# Patient Record
Sex: Male | Born: 1985 | Race: Black or African American | Hispanic: No | Marital: Single | Smoking: Never smoker
Health system: Southern US, Community
[De-identification: ages and names within clinical notes are randomized; demographics above are authoritative.]

## PROBLEM LIST (undated history)

## (undated) ENCOUNTER — Ambulatory Visit (HOSPITAL_COMMUNITY): Payer: Medicaid Other

## (undated) ENCOUNTER — Ambulatory Visit (HOSPITAL_COMMUNITY): Admission: EM | Payer: Medicaid Other | Source: Home / Self Care

## (undated) DIAGNOSIS — Z9989 Dependence on other enabling machines and devices: Secondary | ICD-10-CM

## (undated) DIAGNOSIS — G43909 Migraine, unspecified, not intractable, without status migrainosus: Secondary | ICD-10-CM

## (undated) DIAGNOSIS — F909 Attention-deficit hyperactivity disorder, unspecified type: Secondary | ICD-10-CM

## (undated) DIAGNOSIS — M25569 Pain in unspecified knee: Secondary | ICD-10-CM

## (undated) DIAGNOSIS — G8929 Other chronic pain: Secondary | ICD-10-CM

## (undated) HISTORY — PX: OTHER SURGICAL HISTORY: SHX169

---
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
## 2006-01-03 ENCOUNTER — Emergency Department (HOSPITAL_COMMUNITY): Admission: EM | Admit: 2006-01-03 | Discharge: 2006-01-04 | Payer: Self-pay | Admitting: Emergency Medicine

## 2008-07-14 ENCOUNTER — Emergency Department (HOSPITAL_COMMUNITY): Admission: EM | Admit: 2008-07-14 | Discharge: 2008-07-14 | Payer: Self-pay | Admitting: Emergency Medicine

## 2008-09-04 HISTORY — PX: OTHER SURGICAL HISTORY: SHX169

## 2008-10-13 ENCOUNTER — Emergency Department (HOSPITAL_COMMUNITY): Admission: EM | Admit: 2008-10-13 | Discharge: 2008-10-13 | Payer: Self-pay | Admitting: Family Medicine

## 2009-03-06 ENCOUNTER — Ambulatory Visit (HOSPITAL_COMMUNITY): Admission: EM | Admit: 2009-03-06 | Discharge: 2009-03-06 | Payer: Self-pay | Admitting: Emergency Medicine

## 2009-12-17 ENCOUNTER — Ambulatory Visit: Payer: Self-pay | Admitting: Family Medicine

## 2009-12-17 DIAGNOSIS — S025XXA Fracture of tooth (traumatic), initial encounter for closed fracture: Secondary | ICD-10-CM | POA: Insufficient documentation

## 2010-02-10 ENCOUNTER — Emergency Department (HOSPITAL_COMMUNITY): Admission: EM | Admit: 2010-02-10 | Discharge: 2010-02-10 | Payer: Self-pay | Admitting: Emergency Medicine

## 2010-09-14 ENCOUNTER — Emergency Department (HOSPITAL_COMMUNITY)
Admission: EM | Admit: 2010-09-14 | Discharge: 2010-09-14 | Payer: Self-pay | Source: Home / Self Care | Admitting: Emergency Medicine
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
## 2010-10-19 ENCOUNTER — Encounter: Payer: Self-pay | Admitting: *Deleted

## 2010-11-21 LAB — RAPID STREP SCREEN (MED CTR MEBANE ONLY): Streptococcus, Group A Screen (Direct): POSITIVE — AB

## 2010-12-11 LAB — POCT I-STAT, CHEM 8
BUN: 8 mg/dL (ref 6–23)
Calcium, Ion: 1.17 mmol/L (ref 1.12–1.32)
Creatinine, Ser: 0.9 mg/dL (ref 0.4–1.5)
Glucose, Bld: 103 mg/dL — ABNORMAL HIGH (ref 70–99)
TCO2: 27 mmol/L (ref 0–100)

## 2010-12-11 LAB — CBC
HCT: 44.3 % (ref 39.0–52.0)
Hemoglobin: 15.1 g/dL (ref 13.0–17.0)
RDW: 13.5 % (ref 11.5–15.5)
WBC: 11.1 10*3/uL — ABNORMAL HIGH (ref 4.0–10.5)

## 2010-12-11 LAB — DIFFERENTIAL
Eosinophils Relative: 0 % (ref 0–5)
Lymphocytes Relative: 7 % — ABNORMAL LOW (ref 12–46)
Lymphs Abs: 0.8 10*3/uL (ref 0.7–4.0)
Monocytes Absolute: 0.8 10*3/uL (ref 0.1–1.0)
Monocytes Relative: 7 % (ref 3–12)
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
## 2012-02-29 ENCOUNTER — Encounter (HOSPITAL_COMMUNITY): Payer: Self-pay | Admitting: *Deleted

## 2012-02-29 ENCOUNTER — Emergency Department (HOSPITAL_COMMUNITY)
Admission: EM | Admit: 2012-02-29 | Discharge: 2012-02-29 | Disposition: A | Discharge status: Home / Self Care | Payer: Self-pay | Attending: Emergency Medicine | Admitting: Emergency Medicine

## 2012-02-29 DIAGNOSIS — T310 Burns involving less than 10% of body surface: Secondary | ICD-10-CM | POA: Insufficient documentation

## 2012-02-29 DIAGNOSIS — T23009A Burn of unspecified degree of unspecified hand, unspecified site, initial encounter: Secondary | ICD-10-CM

## 2012-02-29 DIAGNOSIS — Y9239 Other specified sports and athletic area as the place of occurrence of the external cause: Secondary | ICD-10-CM | POA: Insufficient documentation

## 2012-02-29 DIAGNOSIS — X12XXXA Contact with other hot fluids, initial encounter: Secondary | ICD-10-CM | POA: Insufficient documentation

## 2012-02-29 DIAGNOSIS — T23109A Burn of first degree of unspecified hand, unspecified site, initial encounter: Secondary | ICD-10-CM | POA: Insufficient documentation

## 2012-02-29 DIAGNOSIS — Y92838 Other recreation area as the place of occurrence of the external cause: Secondary | ICD-10-CM | POA: Insufficient documentation

## 2012-02-29 HISTORY — DX: Migraine, unspecified, not intractable, without status migrainosus: G43.909

## 2012-02-29 NOTE — Discharge Instructions (Signed)
Burn Care Your skin is a natural barrier to infection. It is the largest organ of your body. Burns damage this natural protection. To help prevent infection, it is very important to follow your caregiver's instructions in the care of your burn. Burns are classified as:  First degree. There is only redness of the skin (erythema). No scarring is expected.   Second degree. There is blistering of the skin. Scarring may occur with deeper burns.   Third degree. All layers of the skin are injured, and scarring is expected.  HOME CARE INSTRUCTIONS   Wash your hands well before changing your bandage.   Change your bandage as often as directed by your caregiver.   Remove the old bandage. If the bandage sticks, you may soak it off with cool, clean water.   Cleanse the burn thoroughly but gently with mild soap and water.   Pat the area dry with a clean, dry cloth.   Apply a thin layer of antibacterial cream to the burn.   Apply a clean bandage as instructed by your caregiver.   Keep the bandage as clean and dry as possible.   Elevate the affected area for the first 24 hours, then as instructed by your caregiver.   Only take over-the-counter or prescription medicines for pain, discomfort, or fever as directed by your caregiver.  SEEK IMMEDIATE MEDICAL CARE IF:   You develop excessive pain.   You develop redness, tenderness, swelling, or red streaks near the burn.   The burned area develops yellowish-white fluid (pus) or a bad smell.   You have a fever.  MAKE SURE YOU:   Understand these instructions.   Will watch your condition.   Will get help right away if you are not doing well or get worse.  Document Released: 08/21/2005 Document Revised: 08/10/2011 Document Reviewed: 01/11/2011 [facility] Patient Information 2012 [place], [place].    RESOURCE GUIDE  Chronic Pain Problems: Contact [facility]  [phone] Patients need to be referred by their primary care  doctor.  Insufficient Money for Medicine: Contact United Way:  call "211" or [facility] [phone].  No Primary Care Doctor: - Call Health Connect  [phone] - can help you locate a primary care doctor that  accepts your insurance, provides certain services, etc. - Physician Referral [phone]  Agencies that provide inexpensive medical care: - [facility]  [phone] - [facility]  [phone] - [facility]  [phone] [facility]  [phone] - Planned Parenthood  [phone] [facility]  [phone]  Medicaid-accepting [facility] Providers: - [facility]- [address]  [phone], Mon-Fri 9am-7pm, Sat 9am-1pm - [facility]- [address] [place], [address] [place]  [phone] - [facility]- [address], [address] [place]  [phone] [facility]- [address]  [phone] - Renaye Rakers- [address] [place], [address], [phone]  Only accepts [place] patients after they have their name  applied to their card  Self Pay (no insurance) in [place]: - Sickle Cell Patients: Dr Willey Blade, [facility]  [address] [place], [phone] - [facility]- [address] [place]  [phone]       [facility] [place]- [address]       -  [facility]- see information above (Speak to [employer] if you do not have insurance)       -  [facility]- [address] [place], [phone]       -  [facility]- [address],  [phone]       -  [facility]- [address] [phone]       -  Dr Julio Sicks-  [address], [address], [place], [phone]       -  [facility]- [address] [phone]       -  [facility]- [address] [phone], also [address], [phone]       -    [facility] [address] [place], [phone], 1st & 3rd Saturday   every month, 10am-1pm  1) Find a Doctor and Pay Out of Pocket Although you won't have to find out who is covered by your insurance plan, it is a good idea to ask around and get recommendations. You will then need to call the office and see if the doctor you have chosen will accept you as a new patient and what types of options they offer for patients who are self-pay. Some doctors offer discounts or will set up payment plans for their patients who do not have insurance, but you will need to ask so you aren't surprised when you get to your appointment.  2) Contact Your Local Health Department Not all health departments have doctors that can see patients for sick visits, but many do, so it is worth a call to see if yours does. If you don't know where your local health department is, you can check in your phone book. The CDC also has a tool to help you locate your state's health department, and many state websites also have listings of all of their local health departments.  3) Find a Walk-in Clinic If your illness is not likely to be very severe or complicated, you may want to try a walk in clinic. These are popping up all over the country in pharmacies, drugstores, and shopping centers. They're usually staffed by nurse practitioners or physician assistants that have been trained to treat common illnesses and complaints. They're usually fairly quick and inexpensive. However, if you have serious medical issues or chronic medical problems, these are probably not your best option  STD Testing - [facility] Department of [facility] [place], STD Clinic, [address], [place], phone [phone] or [phone].  Monday - Friday, call for an appointment. [facility] Department of [employer], STD Clinic, [place] [address], [place], phone [phone] or [phone].  Monday - Friday, call for an  appointment.  Abuse/Neglect: [facility] Child Abuse Hotline (404)887-0733 [facility] Child Abuse Hotline (731) 752-0299 (After Hours)  Emergency Shelter:  [facility] [phone]  Maternity Homes: - [facility] [phone] - [facility] (205) [phone]  MRSA Hotline #:   4585317156  [facility] Resources  [facility]  United Way [facility] Dept. [address]                 [address]         [address]  [address]  Sela Hua Phone:  [phone]                                  Phone:  [phone]                   Phone:  [place], [place] in [place], [address],                                  [place] [phone] or [phone] (After Hours)   [place]  Substance Abuse Resources: - Alcohol and Drug Services  2726217021 - [place] [phone] - The [place] [phone] Chinita Pester [phone] - Residential & Outpatient Substance Abuse Program  [phone]  Psychological Services: - [place]  [place]  [place], [phone] [place]. [address] [place], [place]: [phone] or [phone], PicCapture.uy  Dental Assistance  If unable to pay or uninsured, contact:  [facility] or [facility]. to become qualified for the adult dental clinic.  Patients with Medicaid: [facility] [phone] [address], [place] [address], [phone]  If unable to pay,  or uninsured, contact [facility] [phone]) or [place] [phone] in [place], [place] in [facility]) to become qualified for the adult dental clinic  Other [place], [place], [place], [address], [place], [place], 2nd and 4th Thursday of the month at 6:30am.  10 clients each day by appointment, can sometimes see walk-in patients if someone does not show for an appointment. [facility]- [address] [place]ard D[place]er [place], [place], [address], [place], [place], [place], [address], [place] Department- [place] Department- [place] Department- [phone]
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
## 2012-03-07 NOTE — ED Provider Notes (Signed)
Medical screening examination/treatment/procedure(s) were performed by non-physician practitioner and as supervising physician I was immediately available for consultation/collaboration.  Raeford Razor, MD 03/07/12 2045

## 2013-01-07 ENCOUNTER — Encounter (HOSPITAL_COMMUNITY): Payer: Self-pay | Admitting: Emergency Medicine

## 2013-01-07 ENCOUNTER — Emergency Department (HOSPITAL_COMMUNITY)
Admission: EM | Admit: 2013-01-07 | Discharge: 2013-01-07 | Disposition: A | Discharge status: Home / Self Care | Payer: Self-pay | Attending: Emergency Medicine | Admitting: Emergency Medicine

## 2013-01-07 DIAGNOSIS — L089 Local infection of the skin and subcutaneous tissue, unspecified: Secondary | ICD-10-CM | POA: Insufficient documentation

## 2013-01-07 DIAGNOSIS — R21 Rash and other nonspecific skin eruption: Secondary | ICD-10-CM

## 2013-01-07 DIAGNOSIS — R229 Localized swelling, mass and lump, unspecified: Secondary | ICD-10-CM | POA: Insufficient documentation

## 2013-01-07 DIAGNOSIS — Z8679 Personal history of other diseases of the circulatory system: Secondary | ICD-10-CM | POA: Insufficient documentation

## 2013-01-07 MED ORDER — SULFAMETHOXAZOLE-TRIMETHOPRIM 400-80 MG PO TABS: 1.0000 | ORAL_TABLET | Freq: Two times a day (BID) | ORAL | Status: DC

## 2013-01-07 MED ORDER — SULFAMETHOXAZOLE-TMP DS 800-160 MG PO TABS
1.0000 | ORAL_TABLET | Freq: Once | ORAL | Status: AC
  Administered 2013-01-07: 1 via ORAL
  Filled 2013-01-07: qty 1

## 2013-01-07 NOTE — ED Provider Notes (Signed)
History     CSN: [id number]  Arrival date & time 01/07/13  1231   First MD Initiated Contact with Patient 01/07/13 1246      Chief Complaint  Patient presents with  . Insect Bite    l/arm- 3 swollen, raised areas x 2 days    (Consider location/radiation/quality/duration/timing/severity/associated sxs/prior treatment) Patient is a 27 y.o. male presenting with rash. The history is provided by the patient.  Rash Location:  Shoulder/arm Shoulder/arm rash location:  L arm and R arm Severity:  Mild Associated symptoms: no fever and no myalgias   Associated symptoms comment:  Complains of tender swollen area to left forearm x 2 days. Minimal drainage. He has seen multiple spiders in his house and is concerned about being bitten.    Past Medical History  Diagnosis Date  . Migraine     History reviewed. No pertinent past surgical history.  History reviewed. No pertinent family history.  History  Substance Use Topics  . Smoking status: Never Smoker   . Smokeless tobacco: Not on file  . Alcohol Use: Yes      Review of Systems  Constitutional: Negative for fever and chills.  Musculoskeletal: Negative.  Negative for myalgias.  Skin: Positive for rash.       See HPI.  Neurological: Negative.  Negative for numbness.    Allergies  Review of patient's allergies indicates no known allergies.  Home Medications  No current outpatient prescriptions on file.  BP 125/78  Pulse 62  Temp(Src) 98.2 F (36.8 C) (Oral)  SpO2 96%  Physical Exam  Constitutional: He appears well-developed and well-nourished. No distress.  Musculoskeletal: Normal range of motion. He exhibits no edema.  Skin:  Swollen area measuring approximately 2 cm in diameter to left forearm (x1 dorsum, x1 posterior) and right upper arm posteriorly. No fever. Minimal discomfort. No drainage. There is red streaking extending from left dorsal lesion into upper arm.     ED Course  Procedures (including critical  care time)  Labs Reviewed - No data to display No results found.   No diagnosis found.  1. Rash   MDM  On further questioning, the patient reports that the tattoo on left forearm is 46 days old. DDx includes secondary infection, likely MRSA vs insect or spider bites without abscess. Will cover with antibiotics and follow up if symptoms worsen.        Arnoldo Hooker, PA-C 01/07/13 1319

## 2013-01-07 NOTE — ED Notes (Signed)
Pt reports that he noted 3 red raised spots on l/forearm x 2 days. Increased itching and redness this am

## 2013-01-07 NOTE — ED Provider Notes (Signed)
Medical screening examination/treatment/procedure(s) were performed by non-physician practitioner and as supervising physician I was immediately available for consultation/collaboration.  Gilda Crease, MD 01/07/13 1325

## 2014-11-12 ENCOUNTER — Encounter (HOSPITAL_COMMUNITY): Payer: Self-pay | Admitting: Emergency Medicine

## 2014-11-12 ENCOUNTER — Emergency Department (HOSPITAL_COMMUNITY)
Admission: EM | Admit: 2014-11-12 | Discharge: 2014-11-12 | Disposition: A | Discharge status: Home / Self Care | Payer: No Typology Code available for payment source | Attending: Emergency Medicine | Admitting: Emergency Medicine

## 2014-11-12 DIAGNOSIS — Y92410 Unspecified street and highway as the place of occurrence of the external cause: Secondary | ICD-10-CM | POA: Diagnosis not present

## 2014-11-12 DIAGNOSIS — Z8679 Personal history of other diseases of the circulatory system: Secondary | ICD-10-CM | POA: Insufficient documentation

## 2014-11-12 DIAGNOSIS — G8929 Other chronic pain: Secondary | ICD-10-CM | POA: Diagnosis not present

## 2014-11-12 DIAGNOSIS — S8992XA Unspecified injury of left lower leg, initial encounter: Secondary | ICD-10-CM | POA: Insufficient documentation

## 2014-11-12 DIAGNOSIS — Z792 Long term (current) use of antibiotics: Secondary | ICD-10-CM | POA: Diagnosis not present

## 2014-11-12 DIAGNOSIS — Y9389 Activity, other specified: Secondary | ICD-10-CM | POA: Insufficient documentation

## 2014-11-12 DIAGNOSIS — Y998 Other external cause status: Secondary | ICD-10-CM | POA: Diagnosis not present

## 2014-11-12 DIAGNOSIS — S3992XA Unspecified injury of lower back, initial encounter: Secondary | ICD-10-CM | POA: Insufficient documentation

## 2014-11-12 MED ORDER — CYCLOBENZAPRINE HCL 10 MG PO TABS: 10.0000 mg | ORAL_TABLET | Freq: Two times a day (BID) | ORAL | Status: DC | PRN

## 2014-11-12 MED ORDER — IBUPROFEN 800 MG PO TABS: 800.0000 mg | ORAL_TABLET | Freq: Three times a day (TID) | ORAL | Status: DC
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
## 2014-11-12 NOTE — Discharge Instructions (Signed)

## 2014-11-27 ENCOUNTER — Encounter (HOSPITAL_COMMUNITY): Payer: Self-pay | Admitting: Nurse Practitioner

## 2014-11-27 ENCOUNTER — Emergency Department (HOSPITAL_COMMUNITY)
Admission: EM | Admit: 2014-11-27 | Discharge: 2014-11-27 | Disposition: A | Discharge status: Home / Self Care | Payer: No Typology Code available for payment source | Attending: Emergency Medicine | Admitting: Emergency Medicine

## 2014-11-27 ENCOUNTER — Emergency Department (HOSPITAL_COMMUNITY): Payer: No Typology Code available for payment source

## 2014-11-27 DIAGNOSIS — Z792 Long term (current) use of antibiotics: Secondary | ICD-10-CM | POA: Diagnosis not present

## 2014-11-27 DIAGNOSIS — S8992XA Unspecified injury of left lower leg, initial encounter: Secondary | ICD-10-CM

## 2014-11-27 DIAGNOSIS — W231XXA Caught, crushed, jammed, or pinched between stationary objects, initial encounter: Secondary | ICD-10-CM | POA: Insufficient documentation

## 2014-11-27 DIAGNOSIS — Z79899 Other long term (current) drug therapy: Secondary | ICD-10-CM | POA: Diagnosis not present

## 2014-11-27 DIAGNOSIS — S3992XA Unspecified injury of lower back, initial encounter: Secondary | ICD-10-CM | POA: Diagnosis not present

## 2014-11-27 DIAGNOSIS — Y9289 Other specified places as the place of occurrence of the external cause: Secondary | ICD-10-CM | POA: Diagnosis not present

## 2014-11-27 DIAGNOSIS — G8929 Other chronic pain: Secondary | ICD-10-CM | POA: Diagnosis not present

## 2014-11-27 DIAGNOSIS — G43909 Migraine, unspecified, not intractable, without status migrainosus: Secondary | ICD-10-CM | POA: Diagnosis not present

## 2014-11-27 DIAGNOSIS — Z791 Long term (current) use of non-steroidal anti-inflammatories (NSAID): Secondary | ICD-10-CM | POA: Insufficient documentation

## 2014-11-27 DIAGNOSIS — Y998 Other external cause status: Secondary | ICD-10-CM | POA: Diagnosis not present

## 2014-11-27 DIAGNOSIS — Y9389 Activity, other specified: Secondary | ICD-10-CM | POA: Diagnosis not present

## 2014-11-27 DIAGNOSIS — M545 Low back pain: Secondary | ICD-10-CM

## 2014-11-27 HISTORY — DX: Other chronic pain: G89.29

## 2014-11-27 HISTORY — DX: Pain in unspecified knee: M25.569

## 2014-11-27 MED ORDER — NAPROXEN 375 MG PO TABS: 375.0000 mg | ORAL_TABLET | Freq: Two times a day (BID) | ORAL | Status: DC
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
## 2015-01-06 ENCOUNTER — Encounter (HOSPITAL_COMMUNITY): Payer: Self-pay | Admitting: *Deleted

## 2015-01-06 ENCOUNTER — Emergency Department (HOSPITAL_COMMUNITY)
Admission: EM | Admit: 2015-01-06 | Discharge: 2015-01-06 | Disposition: A | Discharge status: Home / Self Care | Payer: Self-pay | Attending: Emergency Medicine | Admitting: Emergency Medicine

## 2015-01-06 DIAGNOSIS — G43909 Migraine, unspecified, not intractable, without status migrainosus: Secondary | ICD-10-CM | POA: Insufficient documentation

## 2015-01-06 DIAGNOSIS — R519 Headache, unspecified: Secondary | ICD-10-CM

## 2015-01-06 DIAGNOSIS — Z79899 Other long term (current) drug therapy: Secondary | ICD-10-CM | POA: Insufficient documentation

## 2015-01-06 DIAGNOSIS — G8929 Other chronic pain: Secondary | ICD-10-CM | POA: Insufficient documentation

## 2015-01-06 DIAGNOSIS — Z791 Long term (current) use of non-steroidal anti-inflammatories (NSAID): Secondary | ICD-10-CM | POA: Insufficient documentation

## 2015-01-06 DIAGNOSIS — Z792 Long term (current) use of antibiotics: Secondary | ICD-10-CM | POA: Insufficient documentation

## 2015-01-06 DIAGNOSIS — R112 Nausea with vomiting, unspecified: Secondary | ICD-10-CM

## 2015-01-06 DIAGNOSIS — R51 Headache: Secondary | ICD-10-CM

## 2015-01-06 NOTE — Discharge Instructions (Signed)
Return to the emergency room with worsening of symptoms, new symptoms or with symptoms that are concerning , especially severe worsening of headache, visual or speech changes, weakness in face, arms or legs. Please call your doctor for a followup appointment within 24-48 hours. When you talk to your doctor please let them know that you were seen in the emergency department and have them acquire all of your records so that they can discuss the findings with you and formulate a treatment plan to fully care for your new and ongoing problems.  Read below information and follow recommendations. General Headache Without Cause A headache is pain or discomfort felt around the head or neck area. The specific cause of a headache may not be found. There are many causes and types of headaches. A few common ones are:  Tension headaches.  Migraine headaches.  Cluster headaches.  Chronic daily headaches. HOME CARE INSTRUCTIONS   Keep all follow-up appointments with your caregiver or any specialist referral.  Only take over-the-counter or prescription medicines for pain or discomfort as directed by your caregiver.  Lie down in a dark, quiet room when you have a headache.  Keep a headache journal to find out what may trigger your migraine headaches. For example, write down:  What you eat and drink.  How much sleep you get.  Any change to your diet or medicines.  Try massage or other relaxation techniques.  Put ice packs or heat on the head and neck. Use these 3 to 4 times per day for 15 to 20 minutes each time, or as needed.  Limit stress.  Sit up straight, and do not tense your muscles.  Quit smoking if you smoke.  Limit alcohol use.  Decrease the amount of caffeine you drink, or stop drinking caffeine.  Eat and sleep on a regular schedule.  Get 7 to 9 hours of sleep, or as recommended by your caregiver.  Keep lights dim if bright lights bother you and make your headaches worse. SEEK  MEDICAL CARE IF:   You have problems with the medicines you were prescribed.  Your medicines are not working.  You have a change from the usual headache.  You have nausea or vomiting. SEEK IMMEDIATE MEDICAL CARE IF:   Your headache becomes severe.  You have a fever.  You have a stiff neck.  You have loss of vision.  You have muscular weakness or loss of muscle control.  You start losing your balance or have trouble walking.  You feel faint or pass out.  You have severe symptoms that are different from your first symptoms. MAKE SURE YOU:   Understand these instructions.  Will watch your condition.  Will get help right away if you are not doing well or get worse. Document Released: 08/21/2005 Document Revised: 11/13/2011 Document Reviewed: 09/06/2011 [facility] Patient Information 2015 [place], [place]. This information is not intended to replace advice given to you by your health care provider. Make sure you discuss any questions you have with your health care provider.

## 2015-01-06 NOTE — ED Notes (Signed)
Declined W/C at D/C and was escorted to lobby by RN. 

## 2015-01-06 NOTE — ED Notes (Signed)
Pt presents today for a work note . Pt left work with a migraine head ache with nausea. Pt reports this employer will not let him work until he has a Loss adjuster, charteredDoctor's note to OK return to work.

## 2015-01-06 NOTE — ED Provider Notes (Signed)
CSN: [id number]     Arrival date & time 01/06/15  1013 History  This chart was scribed for non-physician practitioner, Oswaldo ConroyVictoria Milessa Hogan, PA-C, working with Rolland PorterMark James, MD by Charline BillsEssence Howell, ED Scribe. This patient was seen in room TR05C/TR05C and the patient's care was started at 10:45 AM.   Chief Complaint  Patient presents with  . wants work note    The history is provided by the patient. No language interpreter was used.   HPI Comments: Pasty ArchBrandon Lawson is a 29 y.o. male, with a h/o migraines, who presents to the Emergency Department for a note to return to work. Pt was sent home from work 2 days ago due to a gradually worsening HA with associated vomiting (one episode) and diarrhea. Pt states that he has had similar symptoms with migraines since he was in middle school. He still reports mild photophobia but denies HA currently, visual disturbances, slurred speech, weakness, abdominal pain, changes in stool, blood in stool, hematochezia, hemoptysis at this time. Pt treated migraine with 2 [employer] Powders and rest.   Past Medical History  Diagnosis Date  . Migraine   . Knee pain, chronic    Past Surgical History  Procedure Laterality Date  . Arm surgery Left    History reviewed. No pertinent family history. History  Substance Use Topics  . Smoking status: Never Smoker   . Smokeless tobacco: Not on file  . Alcohol Use: Yes    Review of Systems  Eyes: Positive for photophobia. Negative for visual disturbance.  Gastrointestinal: Negative for abdominal pain and blood in stool.  Neurological: Negative for speech difficulty, weakness and headaches.  ., Allergies  Review of patient's allergies indicates no known allergies.  Home Medications   Prior to Admission medications   Medication Sig Start Date End Date Taking? Authorizing Provider  cyclobenzaprine (FLEXERIL) 10 MG tablet Take 1 tablet (10 mg total) by mouth 2 (two) times daily as needed for muscle spasms. 11/12/14   Elson AreasLeslie K Sofia,  PA-C  ibuprofen (ADVIL,MOTRIN) 800 MG tablet Take 1 tablet (800 mg total) by mouth 3 (three) times daily. 11/12/14   Elson AreasLeslie K Sofia, PA-C  naproxen (NAPROSYN) 375 MG tablet Take 1 tablet (375 mg total) by mouth 2 (two) times daily with a meal. 11/27/14   Marlon Peliffany Greene, PA-C  sulfamethoxazole-trimethoprim (BACTRIM) 400-80 MG per tablet Take 1 tablet by mouth 2 (two) times daily. 01/07/13   Shari Upstill, PA-C   BP 142/103 mmHg  Pulse 71  Temp(Src) 97.4 F (36.3 C) (Oral)  Resp 16  SpO2 100% Physical Exam  Constitutional: He appears well-developed and well-nourished. No distress.  HENT:  Head: Normocephalic and atraumatic.  Mouth/Throat: Oropharynx is clear and moist.  Eyes: Conjunctivae and EOM are normal. Pupils are equal, round, and reactive to light. Right eye exhibits no discharge. Left eye exhibits no discharge.  Neck: Normal range of motion. Neck supple.  No nuchal rigidity  Cardiovascular: Normal rate and regular rhythm.   Pulmonary/Chest: Effort normal and breath sounds normal. No respiratory distress. He has no wheezes.  Abdominal: Soft. Bowel sounds are normal. He exhibits no distension. There is no tenderness.  Neurological: He is alert. No cranial nerve deficit. Coordination normal.  Speech is clear and goal oriented. Peripheral visual fields intact. Strength 5/5 in upper and lower extremities. Sensation intact. Intact rapid alternating movements, finger to nose, and heel to shin. Negative Romberg. No pronator drift. Normal gait.   Skin: Skin is warm and dry. He is not diaphoretic.  Nursing note and vitals reviewed.  ED Course  Procedures (including critical care time) DIAGNOSTIC STUDIES: Oxygen Saturation is 100% on RA, normal by my interpretation.    COORDINATION OF CARE: 10:50 AM-Discussed treatment plan which includes a work note with pt at bedside and pt agreed to plan.   Labs Review Labs Reviewed - No data to display  Imaging Review No results found.   EKG  Interpretation None      MDM   Final diagnoses:  Nonintractable headache  Non-intractable vomiting with nausea, vomiting of unspecified type   Pt presenting with typical headache with associated nausea and vomiting at this time has resolved. He is just presenting for work note.  Presentation is like pt's typical HA, gradual in onset, not maximal in onset, and not worse of life. No visual or speech changes, and no weakness. Pt is afebrile with no focal neuro deficits or nuchal rigidity. I doubt SAH, ICH, meningits. Pt is to follow up with PCP/wellness center. He has been given a referral. Patient also with hypertension without previous diagnosis. No headaches, visual changes, chest pain, shortness of breath or hematuria. Patient is to present to wellness center for recheck. Pt verbalizes understanding and is agreeable with plan to dc.   Discussed return precautions with patient. Discussed all results and patient verbalizes understanding and agrees with plan.  I personally performed the services described in this documentation, which was scribed in my presence. The recorded information has been reviewed and is accurate.   Oswaldo ConroyVictoria Astella Desir, PA-C 01/06/15 1103  Rolland PorterMark James, MD 01/10/15 [phone]

## 2015-05-16 ENCOUNTER — Encounter (HOSPITAL_COMMUNITY): Payer: Self-pay | Admitting: Emergency Medicine

## 2015-05-16 ENCOUNTER — Emergency Department (HOSPITAL_COMMUNITY)
Admission: EM | Admit: 2015-05-16 | Discharge: 2015-05-16 | Disposition: A | Discharge status: Home / Self Care | Payer: Self-pay | Attending: Emergency Medicine | Admitting: Emergency Medicine

## 2015-05-16 ENCOUNTER — Emergency Department (HOSPITAL_COMMUNITY): Payer: Self-pay

## 2015-05-16 DIAGNOSIS — Z791 Long term (current) use of non-steroidal anti-inflammatories (NSAID): Secondary | ICD-10-CM | POA: Insufficient documentation

## 2015-05-16 DIAGNOSIS — G8929 Other chronic pain: Secondary | ICD-10-CM | POA: Insufficient documentation

## 2015-05-16 DIAGNOSIS — R110 Nausea: Secondary | ICD-10-CM | POA: Insufficient documentation

## 2015-05-16 DIAGNOSIS — R0789 Other chest pain: Secondary | ICD-10-CM

## 2015-05-16 DIAGNOSIS — R0602 Shortness of breath: Secondary | ICD-10-CM | POA: Insufficient documentation

## 2015-05-16 DIAGNOSIS — Z8679 Personal history of other diseases of the circulatory system: Secondary | ICD-10-CM | POA: Insufficient documentation

## 2015-05-16 LAB — CBC
HEMATOCRIT: 43.9 % (ref 39.0–52.0)
HEMOGLOBIN: 14.7 g/dL (ref 13.0–17.0)
MCH: 29.3 pg (ref 26.0–34.0)
MCHC: 33.5 g/dL (ref 30.0–36.0)
MCV: 87.5 fL (ref 78.0–100.0)
Platelets: 188 10*3/uL (ref 150–400)
RBC: 5.02 MIL/uL (ref 4.22–5.81)
RDW: 12.9 % (ref 11.5–15.5)
WBC: 5.4 10*3/uL (ref 4.0–10.5)

## 2015-05-16 LAB — BASIC METABOLIC PANEL
ANION GAP: 7 (ref 5–15)
BUN: 13 mg/dL (ref 6–20)
CHLORIDE: 103 mmol/L (ref 101–111)
CO2: 29 mmol/L (ref 22–32)
Calcium: 9.4 mg/dL (ref 8.9–10.3)
Creatinine, Ser: 0.79 mg/dL (ref 0.61–1.24)
GFR calc Af Amer: >60 mL/min (ref >60)
GFR calc non Af Amer: >60 mL/min (ref >60)
GLUCOSE: 99 mg/dL (ref 65–99)
POTASSIUM: 3.8 mmol/L (ref 3.5–5.1)
Sodium: 139 mmol/L (ref 135–145)

## 2015-05-16 LAB — I-STAT TROPONIN, ED: Troponin i, poc: 0 ng/mL (ref 0.00–0.08)

## 2015-05-16 NOTE — ED Provider Notes (Signed)
CSN: [id number]     Arrival date & time 05/16/15  1856 History   First MD Initiated Contact with Patient 05/16/15 2036     Chief Complaint  Patient presents with  . Chest Pain     (Consider location/radiation/quality/duration/timing/severity/associated sxs/prior Treatment) Patient is a 29 y.o. male presenting with chest pain.  Chest Pain Pain location:  L chest Pain quality: sharp   Pain quality comment:  Like needle Pain radiates to:  Does not radiate Pain radiates to the back: no   Pain severity:  Severe Onset quality:  Gradual Duration: started this AM when woke up, went with normal routine and improved, then off  and on currently 3/10. Timing:  Constant Progression:  Waxing and waning Chronicity:  New Relieved by: putting pressure from hand. Worsened by:  Deep breathing Ineffective treatments: exertion did not change. Associated symptoms: nausea and shortness of breath (mild)   Associated symptoms: no abdominal pain, no back pain, no cough, no diaphoresis, no fever, no headache and not vomiting   Associated symptoms comment:  Recent stress regarding job Risk factors: hypertension   Risk factors: no coronary artery disease, no diabetes mellitus, no high cholesterol, no prior DVT/PE, no smoking and no surgery   Risk factors comment:  No fam hx early death or heart disease   Past Medical History  Diagnosis Date  . Migraine   . Knee pain, chronic    Past Surgical History  Procedure Laterality Date  . Arm surgery Left    No family history on file. Social History  Substance Use Topics  . Smoking status: Never Smoker   . Smokeless tobacco: None  . Alcohol Use: Yes    Review of Systems  Constitutional: Negative for fever and diaphoresis.  HENT: Negative for sore throat.   Eyes: Negative for visual disturbance.  Respiratory: Positive for shortness of breath (mild). Negative for cough.   Cardiovascular: Positive for chest pain.  Gastrointestinal: Positive for  nausea. Negative for vomiting and abdominal pain.  Genitourinary: Negative for difficulty urinating.  Musculoskeletal: Negative for back pain and neck stiffness.  Skin: Negative for rash.  Neurological: Negative for syncope and headaches.      Allergies  Review of patient's allergies indicates no known allergies.  Home Medications   Prior to Admission medications   Medication Sig Start Date End Date Taking? Authorizing Provider  cyclobenzaprine (FLEXERIL) 10 MG tablet Take 1 tablet (10 mg total) by mouth 2 (two) times daily as needed for muscle spasms. 11/12/14   Elson Areas, PA-C  ibuprofen (ADVIL,MOTRIN) 800 MG tablet Take 1 tablet (800 mg total) by mouth 3 (three) times daily. 11/12/14   Elson Areas, PA-C  naproxen (NAPROSYN) 375 MG tablet Take 1 tablet (375 mg total) by mouth 2 (two) times daily with a meal. 11/27/14   Marlon Pel, PA-C  sulfamethoxazole-trimethoprim (BACTRIM) 400-80 MG per tablet Take 1 tablet by mouth 2 (two) times daily. 01/07/13   Shari Upstill, PA-C   BP 159/99 mmHg  Pulse 49  Temp(Src) 98.3 F (36.8 C) (Oral)  Resp 19  SpO2 100% Physical Exam  Constitutional: He is oriented to person, place, and time. He appears well-developed and well-nourished. No distress.  HENT:  Head: Normocephalic and atraumatic.  Eyes: Conjunctivae and EOM are normal.  Neck: Normal range of motion.  Cardiovascular: Normal rate, regular rhythm, normal heart sounds and intact distal pulses.  Exam reveals no gallop and no friction rub.   No murmur heard. Pulmonary/Chest: Effort normal and  breath sounds normal. No respiratory distress. He has no wheezes. He has no rales. He exhibits tenderness (left ).  Abdominal: Soft. He exhibits no distension. There is no tenderness. There is no guarding.  Musculoskeletal: He exhibits no edema.  Neurological: He is alert and oriented to person, place, and time.  Skin: Skin is warm and dry. He is not diaphoretic.  Nursing note and vitals  reviewed.   ED Course  Procedures (including critical care time) Labs Review Labs Reviewed  BASIC METABOLIC PANEL  CBC  I-STAT TROPOININ, ED    Imaging Review Dg Chest 2 View  05/16/2015   CLINICAL DATA:  29 year old male with chest pain  EXAM: CHEST  2 VIEW  COMPARISON:  None.  FINDINGS: The heart size and mediastinal contours are within normal limits. Both lungs are clear. The visualized skeletal structures are unremarkable.  IMPRESSION: No active cardiopulmonary disease.   Electronically Signed   By: Elgie Collard M.D.   On: 05/16/2015 20:11   I have personally reviewed and evaluated these images and lab results as part of my medical decision-making.   EKG Interpretation   Date/Time:  Sunday May 16 2015 19:01:24 EDT Ventricular Rate:  52 PR Interval:  204 QRS Duration: 82 QT Interval:  432 QTC Calculation: 401 R Axis:   90 Text Interpretation:  Sinus bradycardia Rightward axis Septal infarct ,  age undetermined Abnormal ECG Confirmed by BEATON  MD, ROBERT ([id number]) on  05/16/2015 7:10:51 PM      MDM   Final diagnoses:  None   29 year old  male with history of hypertension presents with concern of chest pain and syncope. Differential diagnosis for chest pain includes pulmonary embolus, dissection, pneumothorax, pneumonia, ACS, myocarditis, pericarditis.  EKG was done and evaluate by me and showed no acute ST changes and no signs of pericarditis with no prior EKG to compare.  EKG evaluated by me and shows sinus rhythm with no sign of prolonged QTc, no brugada, no sign of HOCM, no ST abnormalities.  No family hx of early death. Chest x-ray was done and evaluated by me and radiology and showed no sign of pneumonia or pneumothorax. Patient is PERC negative and low risk [place] and have low suspicion for PE.  Patient is low risk HEART score and had single troponin greater than 6hr after CP began which was negative.  No sign of anemia, electrolyte abnormality is suggests these  is causing syncope. Patient does report he was out late drinking, and episode of syncope preceded by lightheadedness is likely secondary to dehydration.  Given laboratory, imaging evaluation, history and physical have low suspicion for pulmonary embolus, pneumonia, ACS, myocarditis, pericarditis, dissection.    Patient most likely with musculoskeletal chest pain given pain with palpation and deep breathing. Patient discharged in stable condition with understanding of reasons to return and recommend PCP follow up.    Alvira Monday, MD 05/17/15 [phone]

## 2015-05-16 NOTE — Discharge Instructions (Signed)

## 2015-05-16 NOTE — ED Notes (Signed)
Pt. reports central chest pain with mild SOB onset this morning  , no nausea or diaphoresis , non radiating , denies cough or congestion .

## 2016-02-28 IMAGING — CR DG LUMBAR SPINE COMPLETE 4+V
5 series · 5 of 5 positions shown · non-contrast
Comparison: None.

CLINICAL DATA: Chronic intermittent low back pain.

EXAM:
LUMBAR SPINE - COMPLETE 4+ VIEW

[l-spine ap]
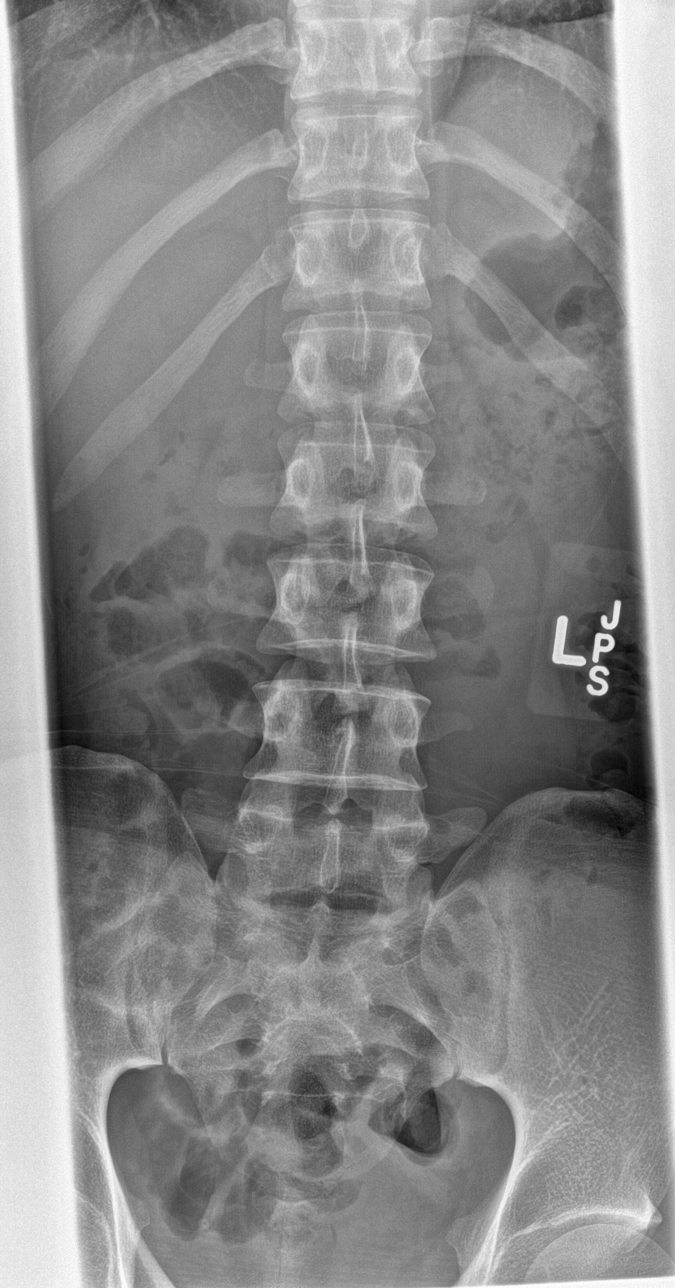

[l-spine obl (1 of 2)]
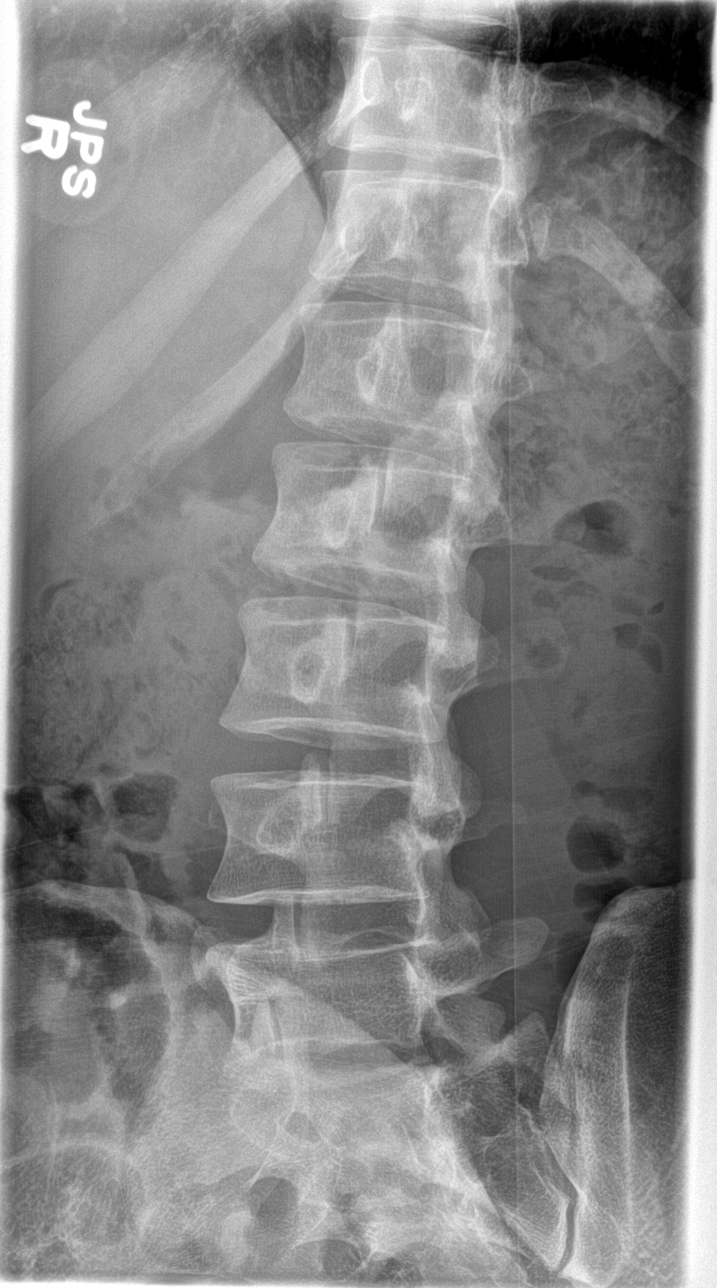

[l-spine obl (2 of 2)]
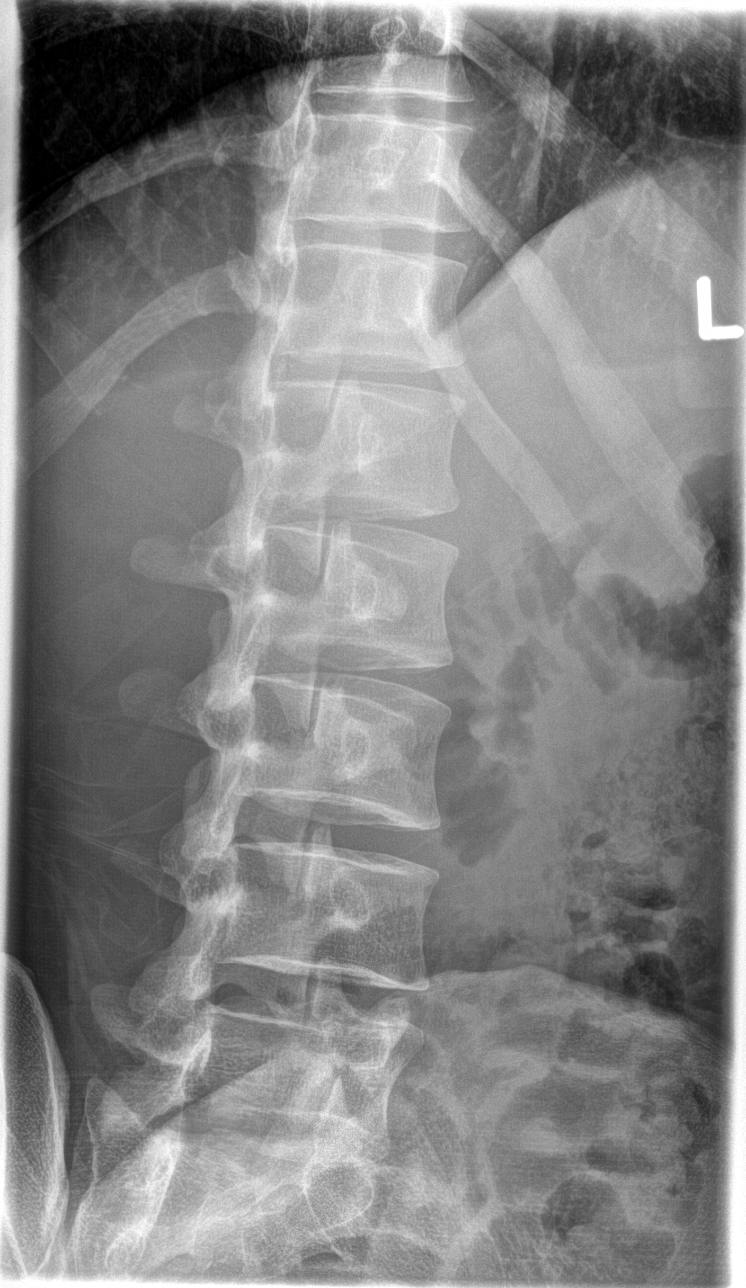

[l-spine lat]
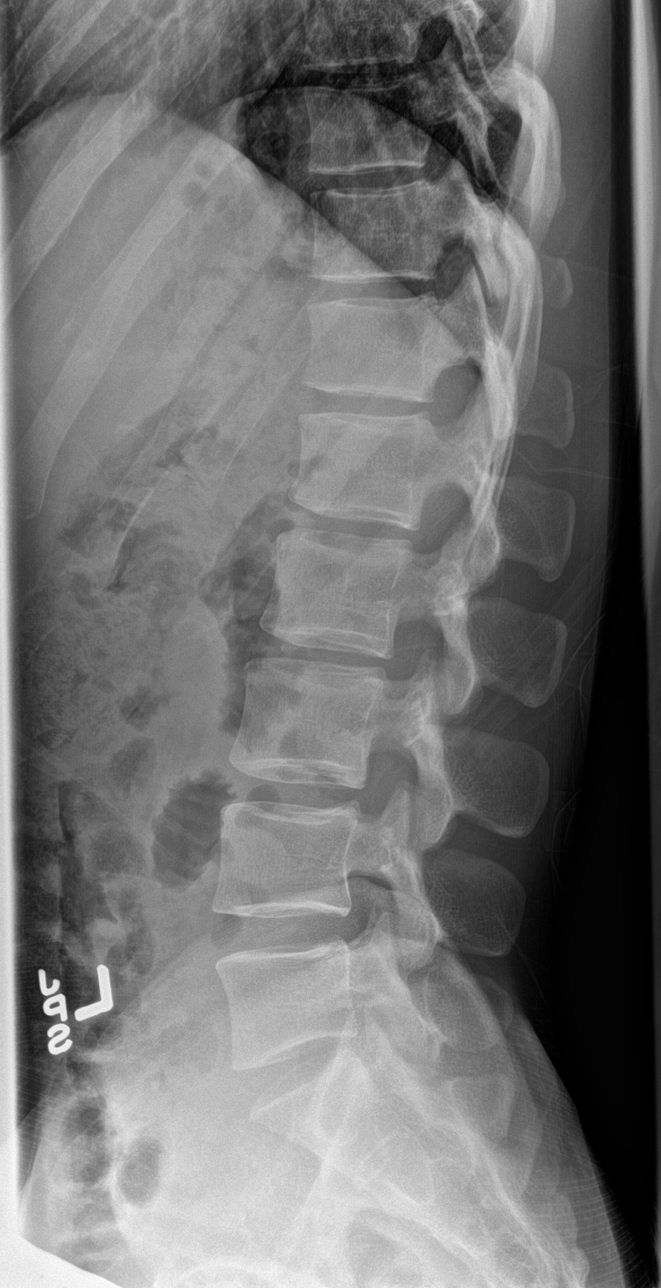

[l-spine spot]
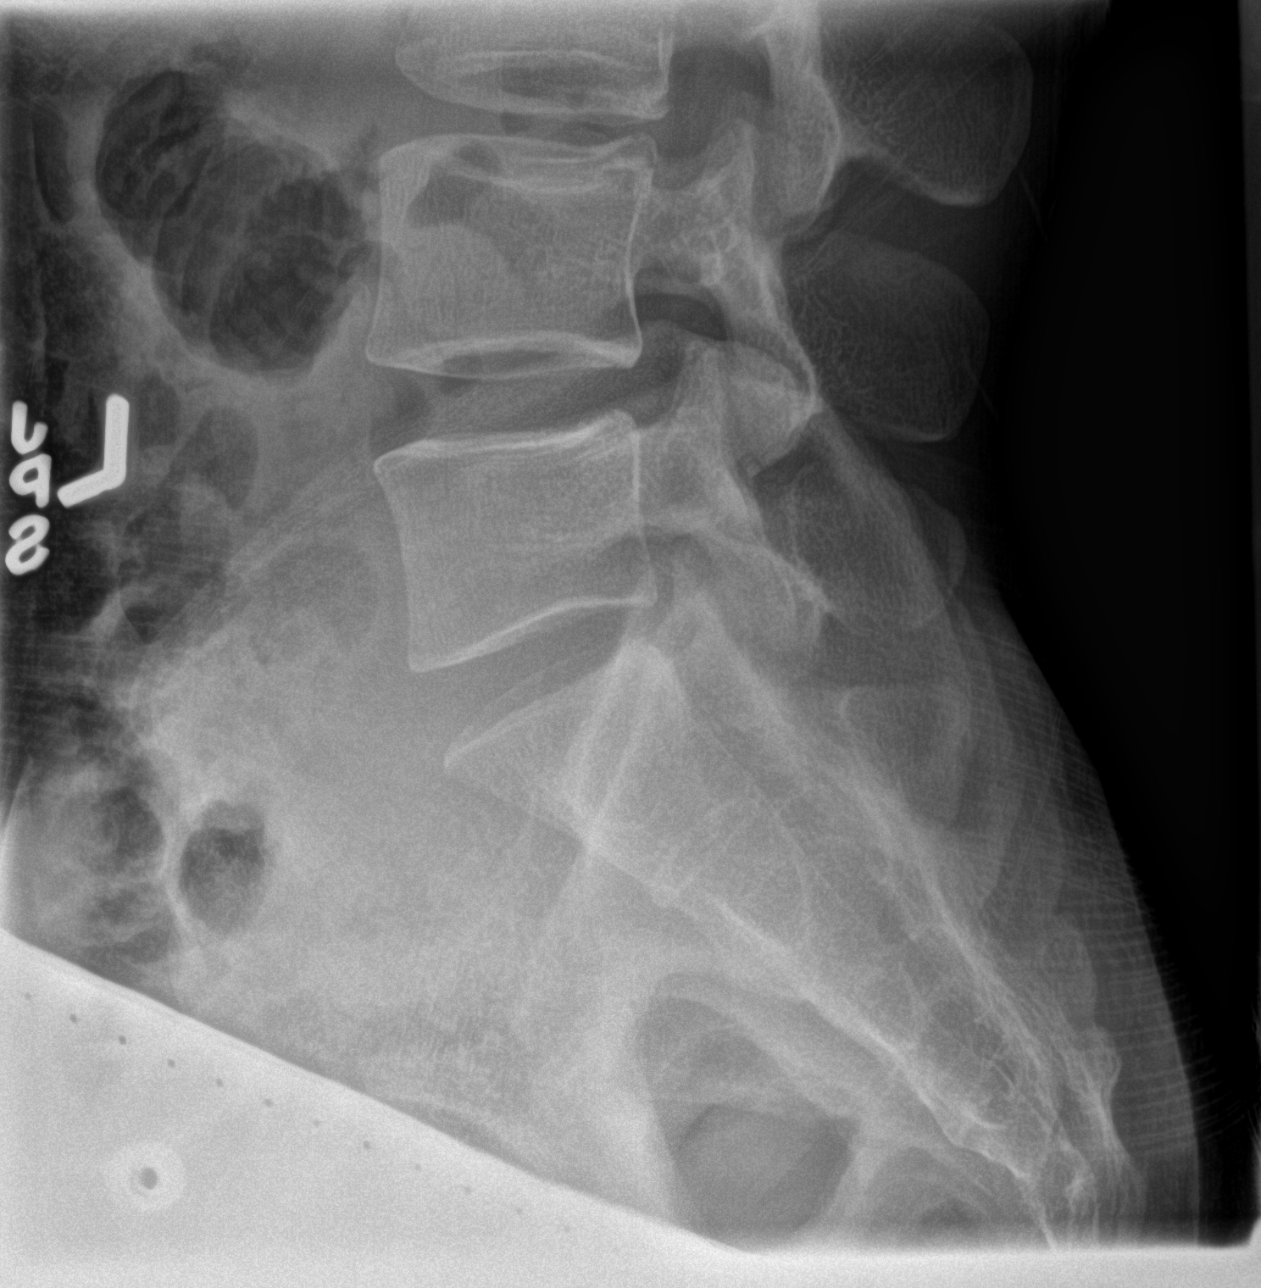

[5 of 5 positions shown; findings below may reference images not displayed]

FINDINGS: There is no evidence of lumbar spine fracture. Alignment is normal.
Intervertebral disc spaces are maintained.
IMPRESSION: Normal exam.

## 2016-08-16 IMAGING — CR DG CHEST 2V
2 series · 2 of 2 positions shown · non-contrast
Comparison: None.

CLINICAL DATA: 29-year-old male with chest pain

EXAM:
CHEST  2 VIEW

[chest pa]
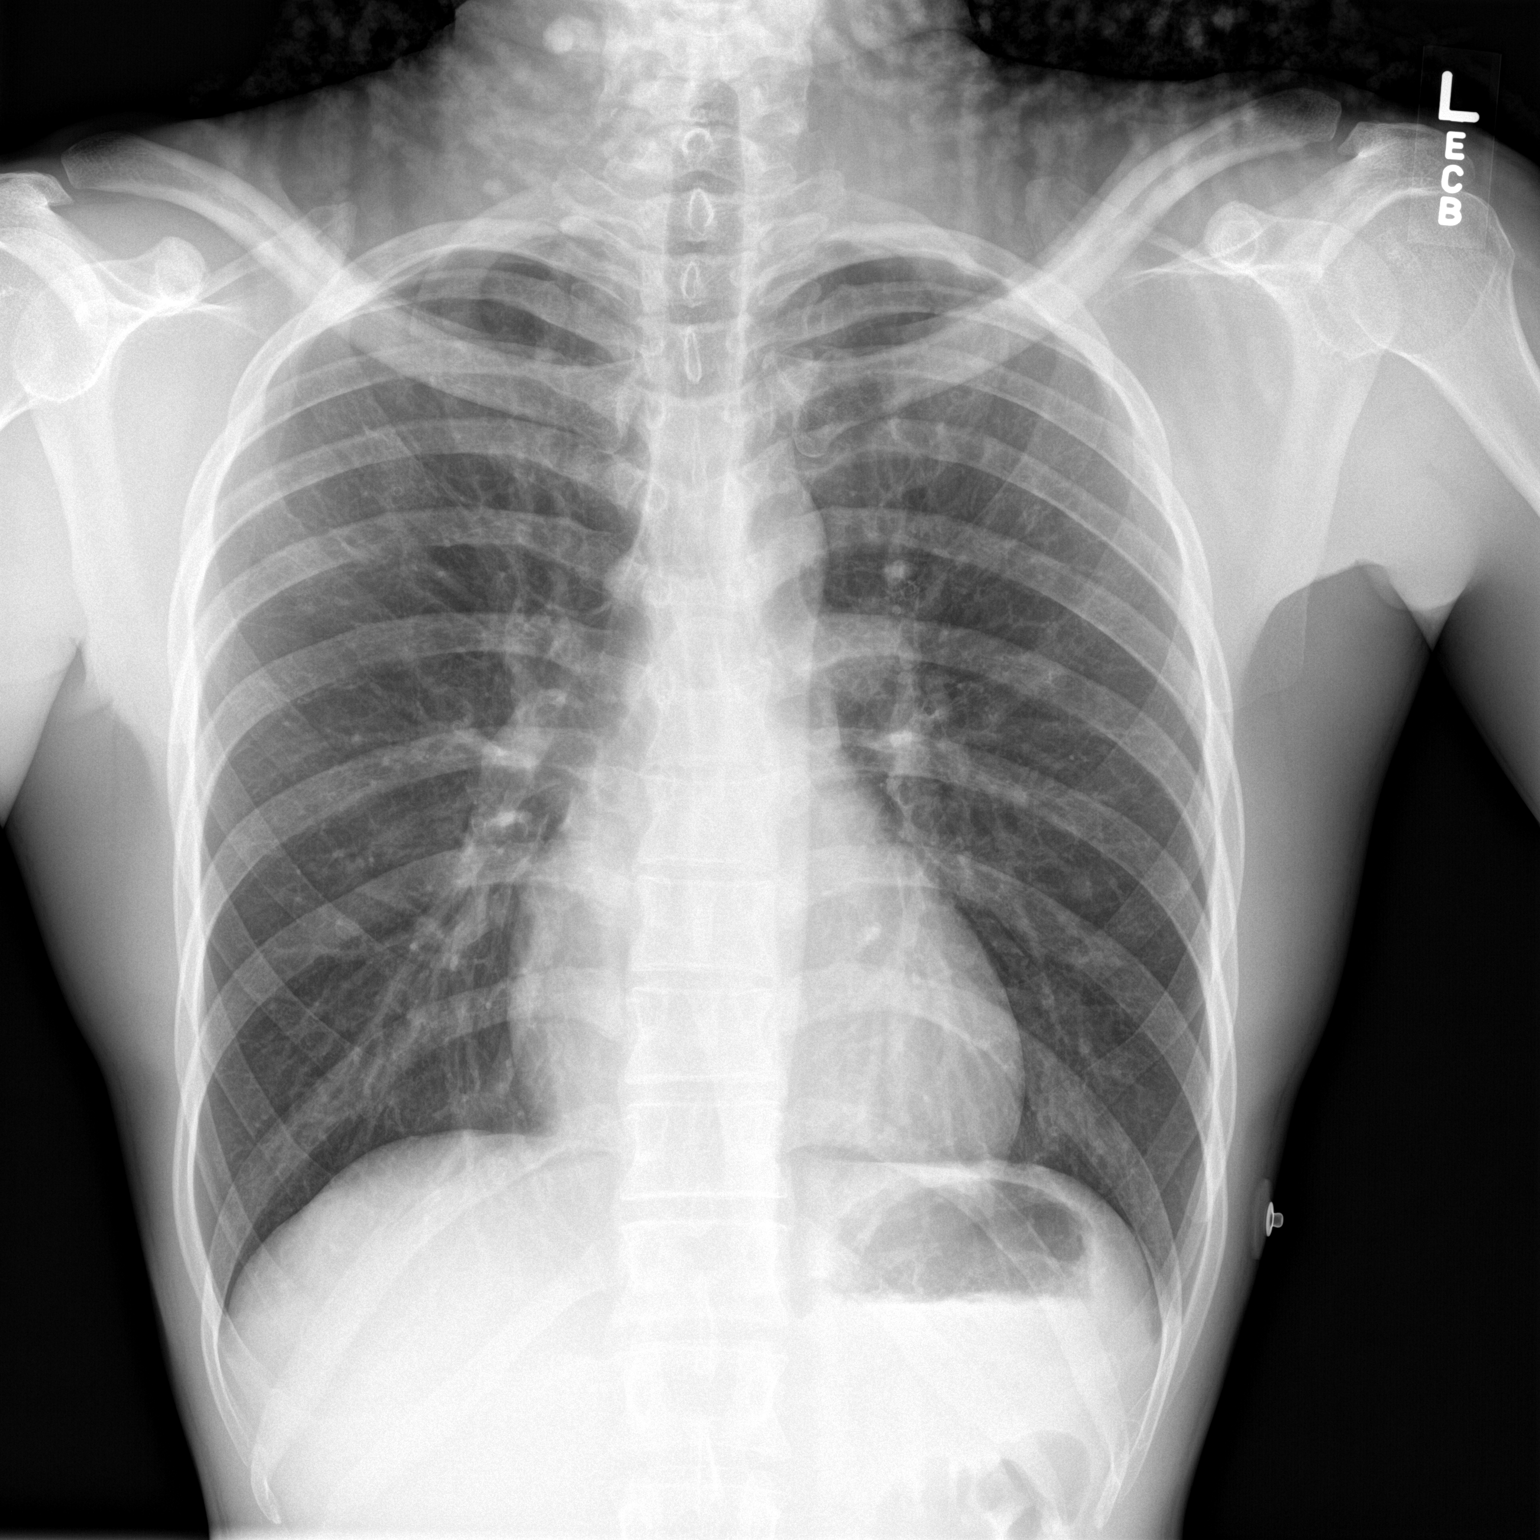

[chest lat]
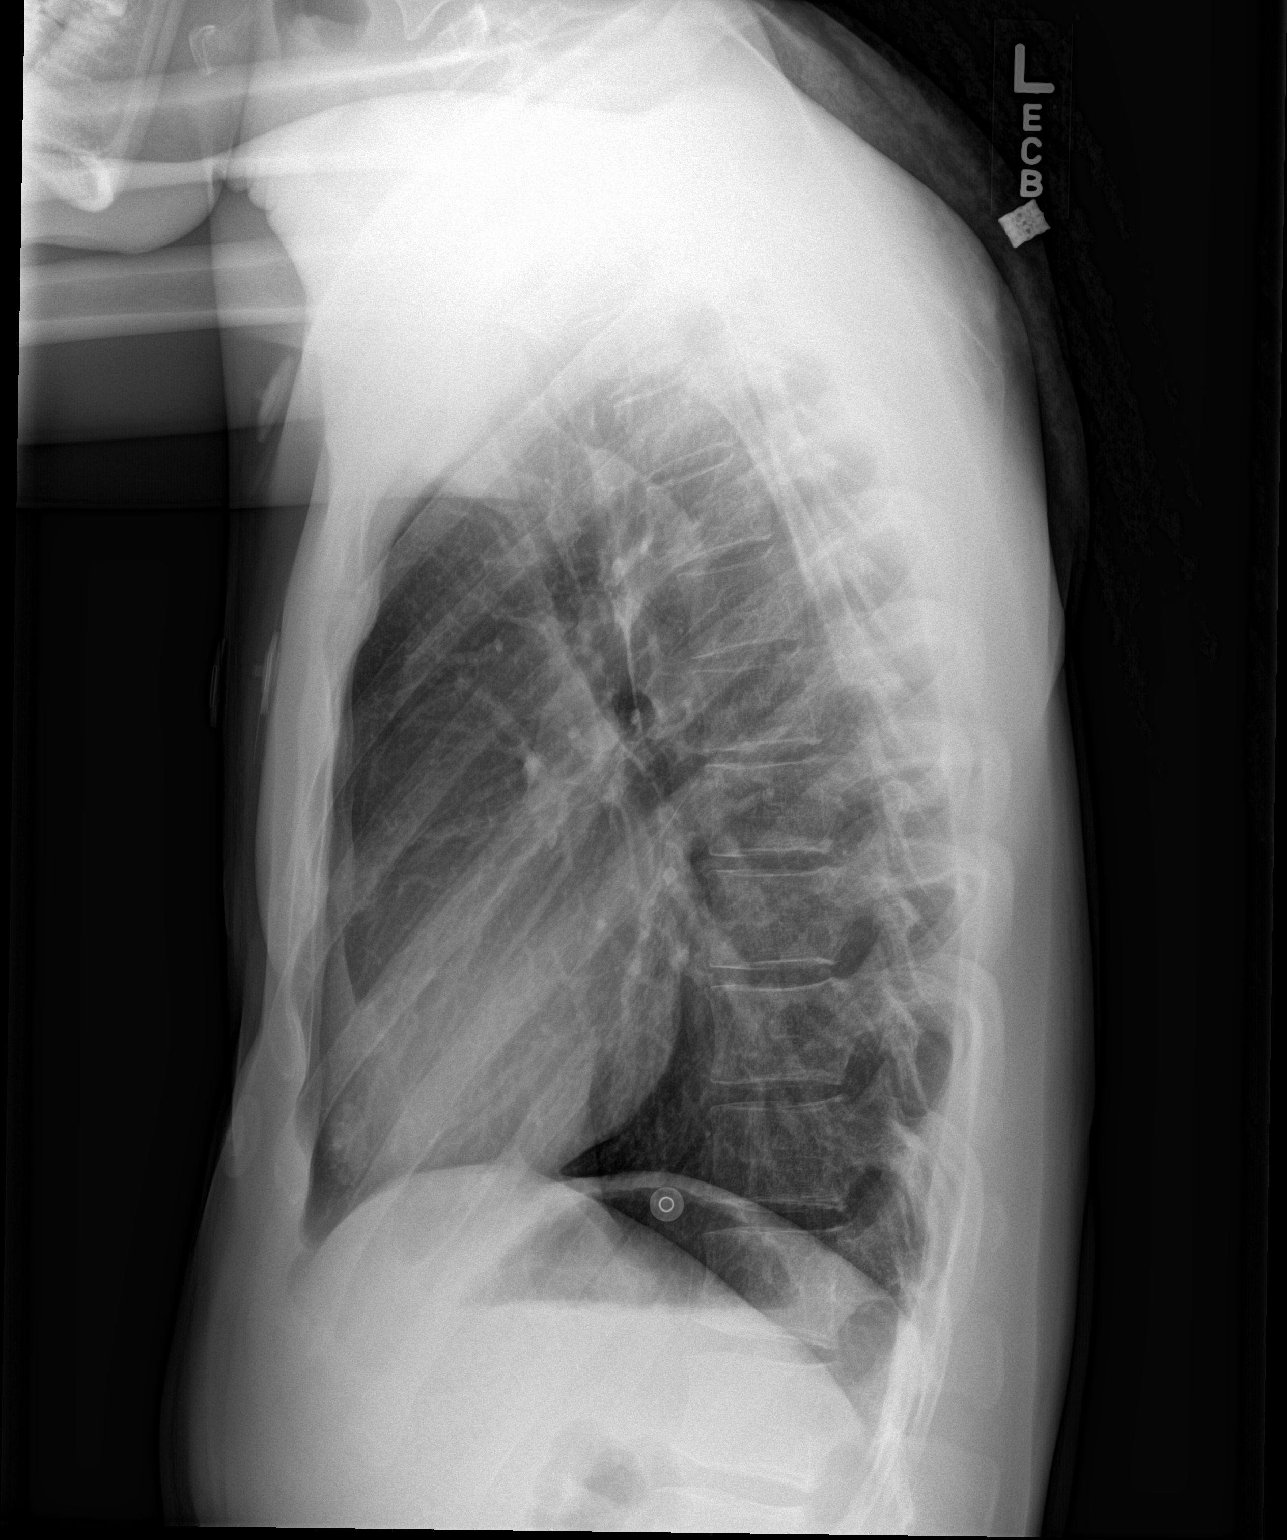

[2 of 2 positions shown; findings below may reference images not displayed]

FINDINGS: The heart size and mediastinal contours are within normal limits.
Both lungs are clear. The visualized skeletal structures are
unremarkable.
IMPRESSION: No active cardiopulmonary disease.

## 2017-05-14 ENCOUNTER — Emergency Department (HOSPITAL_COMMUNITY)
Admission: EM | Admit: 2017-05-14 | Discharge: 2017-05-14 | Disposition: A | Discharge status: Home / Self Care | Payer: Self-pay | Attending: Emergency Medicine | Admitting: Emergency Medicine

## 2017-05-14 DIAGNOSIS — Z79899 Other long term (current) drug therapy: Secondary | ICD-10-CM | POA: Insufficient documentation

## 2017-05-14 DIAGNOSIS — R300 Dysuria: Secondary | ICD-10-CM | POA: Insufficient documentation

## 2017-05-14 DIAGNOSIS — N509 Disorder of male genital organs, unspecified: Secondary | ICD-10-CM | POA: Insufficient documentation

## 2017-05-14 DIAGNOSIS — R369 Urethral discharge, unspecified: Secondary | ICD-10-CM | POA: Insufficient documentation

## 2017-05-14 LAB — URINALYSIS, ROUTINE W REFLEX MICROSCOPIC
BILIRUBIN URINE: NEGATIVE
Glucose, UA: NEGATIVE mg/dL
HGB URINE DIPSTICK: NEGATIVE
Ketones, ur: 20 mg/dL — AB
Leukocytes, UA: NEGATIVE
Nitrite: NEGATIVE
Protein, ur: NEGATIVE mg/dL
SPECIFIC GRAVITY, URINE: 1.028 (ref 1.005–1.030)
pH: 5 (ref 5.0–8.0)

## 2017-05-14 MED ORDER — CLOTRIMAZOLE 1 % EX CREA: TOPICAL_CREAM | CUTANEOUS | 0 refills | Status: DC

## 2017-05-14 NOTE — Discharge Instructions (Signed)
Apply the cream as directed.   The test results with take 2-3 days to return. If there is an abnormal result, you will be notified. If you do not hear anything, that means the results were negative. You can also log on MyChart to see the results.   If you are positive, your sexual partner needs to be treated too. Do not have sexual intercourse for the next 7 days and after your partner has been treated.   Follow-up with your primary care doctor in 2-4 days. If you do not have a primary care doctor, you can use one listed in the paperwork.   Return to the Emergency Department for any rash to the genitals, fever, abdominal pain, difficulty breathing, nausea/vomiting or any other worsening or concerning symptoms.

## 2017-05-14 NOTE — ED Provider Notes (Signed)
[facility]-EMERGENCY DEPT Provider Note   CSN: [id number] Arrival date & time: 05/14/17  4098     History   Chief Complaint Chief Complaint  Patient presents with  . Penile Discharge  . Dysuria    HPI Casey Lawson is a 31 y.o. male Who presents with one month of dysuria, penile discharge. Patient also reports bilateral groin itching. He states that he was seen by the health department promptly one half months ago for STD testing and was negative. He states he is not currently sexually active. He reports the last time he was sexually active was approximately 2 months ago. Patient also reporting some itching to the bilateral groin area. No rash noted. He denies any sores, ulcers, rashes to the area. Patient denies any fever, chills, hematuria, hematospermia.   The history is provided by the patient.    Past Medical History:  Diagnosis Date  . Knee pain, chronic   . Migraine     Patient Active Problem List   Diagnosis Date Noted  . TOOTH BROKEN FRACTURE DUE TO TRAUMA COMPLICATED 12/17/2009    Past Surgical History:  Procedure Laterality Date  . arm surgery Left        Home Medications    Prior to Admission medications   Medication Sig Start Date End Date Taking? Authorizing Provider  clotrimazole (LOTRIMIN) 1 % cream Apply to affected area 2 times daily 05/14/17   Graciella Freer A, PA-C  cyclobenzaprine (FLEXERIL) 10 MG tablet Take 1 tablet (10 mg total) by mouth 2 (two) times daily as needed for muscle spasms. 11/12/14   Elson Areas, PA-C  ibuprofen (ADVIL,MOTRIN) 800 MG tablet Take 1 tablet (800 mg total) by mouth 3 (three) times daily. 11/12/14   Elson Areas, PA-C  naproxen (NAPROSYN) 375 MG tablet Take 1 tablet (375 mg total) by mouth 2 (two) times daily with a meal. 11/27/14   Neva Seat, Tiffany, PA-C  sulfamethoxazole-trimethoprim (BACTRIM) 400-80 MG per tablet Take 1 tablet by mouth 2 (two) times daily. 01/07/13   Elpidio Anis, PA-C    Family History No family  history on file.  Social History Social History  Substance Use Topics  . Smoking status: Never Smoker  . Smokeless tobacco: Not on file  . Alcohol use Yes     Allergies   Patient has no known allergies.   Review of Systems Review of Systems  Constitutional: Negative for chills and fever.  Gastrointestinal: Negative for nausea and vomiting.  Genitourinary: Positive for discharge and dysuria. Negative for penile pain, penile swelling, scrotal swelling and testicular pain.     Physical Exam Updated Vital Signs BP (!) 156/88 (BP Location: Left Arm)   Pulse (!) 50   Temp 98.4 F (36.9 C) (Oral)   Resp 18   SpO2 100%   Physical Exam  Constitutional: He appears well-developed and well-nourished.  HENT:  Head: Normocephalic and atraumatic.  Eyes: Conjunctivae and EOM are normal. Right eye exhibits no discharge. Left eye exhibits no discharge. No scleral icterus.  Pulmonary/Chest: Effort normal.  Abdominal: Hernia confirmed negative in the right inguinal area and confirmed negative in the left inguinal area.  Genitourinary: Penis normal. Right testis shows swelling. Right testis shows no tenderness. Right testis is descended. Left testis shows no swelling and no tenderness. Left testis is descended. Circumcised. No discharge found.  Genitourinary Comments: The exam was performed with a chaperone present. Normal external male genitalia. No sores, ulcers, rashes. No vesicles noted. No penile discharge noted.   Neurological:  He is alert.  Skin: Skin is warm and dry.  Psychiatric: He has a normal mood and affect. His speech is normal and behavior is normal.  Nursing note and vitals reviewed.    ED Treatments / Results  Labs (all labs ordered are listed, but only abnormal results are displayed) Labs Reviewed  URINALYSIS, ROUTINE W REFLEX MICROSCOPIC - Abnormal; Notable for the following:       Result Value   Ketones, ur 20 (*)    All other components within normal limits    HIV ANTIBODY (ROUTINE TESTING)  GC/CHLAMYDIA PROBE AMP ([place]    EKG  EKG Interpretation None       Radiology No results found.  Procedures Procedures (including critical care time)  Medications Ordered in ED Medications - No data to display   Initial Impression / Assessment and Plan / ED Course  I have reviewed the triage vital signs and the nursing notes.  Pertinent labs & imaging results that were available during my care of the patient were reviewed by me and considered in my medical decision making (see chart for details).     31 year old male who presents with dysuria and penile discharge and ongoing for the past month. No fever, chills. No rashes, ulcers, sores. Recently tested negative for STDs at the health department promptly one month ago. No new sexual partners. Patient is afebrile, non-toxic appearing, sitting comfortably on examination table. Vital signs reviewed and stable. Normal GU exam. Will plan to check UA, GC/90. Patient also requesting to be tested for HIV. Physical exam is not consistent with herpes infection. Consider GC/chlamydia. Also consider fungal infection to the groin given history of itching. Plan to obtain UA for evaluation of UTI. Also plan to obtain HIV, GC/chlamydia testing.  UA reviewed. Negative for any acute signs of infection. Discussed results with patient. Explained to patient that we can treat him for presumed STD today. At this point in time, patient does not wish to be treated. He wishes to wait for the results of the test before seeking treatment. Explained risks benefits of treatment versus taken any today. He expresses full understanding of this versus benefits and still chooses not to receive treatment today. Patient exhibits full medical decision making capacity. We'll plan to provide antifungal cream for symptoms. Provided patient with a list of clinic resources to use if he does not have a PCP. Instructed to call  them today to arrange follow-up in the next 24-48 hours. Strict return precautions discussed. Patient expresses understanding and agreement to plan.    Final Clinical Impressions(s) / ED Diagnoses   Final diagnoses:  Penile discharge  Dysuria    New Prescriptions Discharge Medication List as of 05/14/2017 12:17 PM    START taking these medications   Details  clotrimazole (LOTRIMIN) 1 % cream Apply to affected area 2 times daily, Print         Rosana HoesLayden, Providencia Hottenstein A, PA-C 05/14/17 2103    Little, Ambrose Finlandachel Morgan, MD 05/15/17 [phone]

## 2017-05-14 NOTE — ED Triage Notes (Signed)
Pt reports 1 month of itching, penile discharge and burning with urination. Pt states was checked at health dept recently and screening was negative. States no new sexual partners. Denies recent fever.

## 2017-05-14 NOTE — ED Notes (Signed)
Declined W/C at D/C and was escorted to lobby by RN. 

## 2017-05-15 LAB — GC/CHLAMYDIA PROBE AMP (~~LOC~~) NOT AT ARMC
CHLAMYDIA, DNA PROBE: NEGATIVE
NEISSERIA GONORRHEA: NEGATIVE

## 2017-05-15 LAB — HIV ANTIBODY (ROUTINE TESTING W REFLEX): HIV Screen 4th Generation wRfx: NONREACTIVE

## 2017-07-05 ENCOUNTER — Emergency Department (HOSPITAL_COMMUNITY)
Admission: EM | Admit: 2017-07-05 | Discharge: 2017-07-05 | Disposition: A | Discharge status: Home / Self Care | Payer: Self-pay | Attending: Emergency Medicine | Admitting: Emergency Medicine

## 2017-07-05 ENCOUNTER — Encounter (HOSPITAL_COMMUNITY): Payer: Self-pay | Admitting: Emergency Medicine

## 2017-07-05 ENCOUNTER — Emergency Department (HOSPITAL_COMMUNITY): Payer: Self-pay

## 2017-07-05 DIAGNOSIS — B3749 Other urogenital candidiasis: Secondary | ICD-10-CM | POA: Insufficient documentation

## 2017-07-05 DIAGNOSIS — R51 Headache: Secondary | ICD-10-CM | POA: Insufficient documentation

## 2017-07-05 DIAGNOSIS — B379 Candidiasis, unspecified: Secondary | ICD-10-CM

## 2017-07-05 DIAGNOSIS — F17210 Nicotine dependence, cigarettes, uncomplicated: Secondary | ICD-10-CM | POA: Insufficient documentation

## 2017-07-05 DIAGNOSIS — Z202 Contact with and (suspected) exposure to infections with a predominantly sexual mode of transmission: Secondary | ICD-10-CM | POA: Insufficient documentation

## 2017-07-05 DIAGNOSIS — R519 Headache, unspecified: Secondary | ICD-10-CM

## 2017-07-05 LAB — URINALYSIS, ROUTINE W REFLEX MICROSCOPIC
Bilirubin Urine: NEGATIVE
Glucose, UA: NEGATIVE mg/dL
Hgb urine dipstick: NEGATIVE
KETONES UR: 20 mg/dL — AB
LEUKOCYTES UA: NEGATIVE
NITRITE: NEGATIVE
PH: 7 (ref 5.0–8.0)
PROTEIN: NEGATIVE mg/dL
Specific Gravity, Urine: 1.02 (ref 1.005–1.030)

## 2017-07-05 MED ORDER — AZITHROMYCIN 1 G PO PACK
1.0000 g | PACK | Freq: Once | ORAL | Status: AC
  Administered 2017-07-05: 1 g via ORAL
  Filled 2017-07-05: qty 1

## 2017-07-05 MED ORDER — CEFTRIAXONE SODIUM 250 MG IJ SOLR
250.0000 mg | Freq: Once | INTRAMUSCULAR | Status: AC
  Administered 2017-07-05: 250 mg via INTRAMUSCULAR
  Filled 2017-07-05: qty 250

## 2017-07-05 MED ORDER — NYSTATIN-TRIAMCINOLONE 100000-0.1 UNIT/GM-% EX CREA: TOPICAL_CREAM | CUTANEOUS | 2 refills | Status: DC

## 2017-07-05 NOTE — ED Notes (Signed)
PT states he is having penile discharge and burning with urination. Waking up every morning with headache. Reports he "has been googling and doing his research and wants to be treated for every curable STD there is".

## 2017-07-05 NOTE — ED Provider Notes (Signed)
Emergency Department Provider Note   I have reviewed the triage vital signs and the nursing notes.   HISTORY  Chief Complaint Exposure to STD and Headache   HPI Casey Lawson is a 31 y.o. male here with 2 issues.  Patient states he has had progressively worsening headaches for the last couple weeks always in the morning improved little bit throughout the day but never entirely go away even with [employer] powers.  Has a history of having headaches but nothing like this before.  No neurologic symptoms.  He also complains of dysuria, penile discharge and itchiness to his left inguinal fold.  States this is been going on for about the same time.  After he found out that his girlfriend had intercourse with another male.  States he was checked for STDs at some time in the past but they were negative and he knows that sometimes they can be initially negative and subsequently be positive.  No abdominal pain, back pain, shortness of breath, chest pain, nausea, vomiting, diarrhea or constipation.  No other associated modifying symptoms.   Past Medical History:  Diagnosis Date  . Knee pain, chronic   . Migraine     Patient Active Problem List   Diagnosis Date Noted  . TOOTH BROKEN FRACTURE DUE TO TRAUMA COMPLICATED 12/17/2009    Past Surgical History:  Procedure Laterality Date  . arm surgery Left     Current Outpatient Rx  . Order #: [id number] Class: Print  . Order #: [id number] Class: Print  . Order #: [id number] Class: Print  . Order #: [id number] Class: Print  . Order #: [id number] Class: Print  . Order #: [id number] Class: Print    Allergies Patient has no known allergies.  No family history on file.  Social History Social History  Substance Use Topics  . Smoking status: Light Tobacco Smoker  . Smokeless tobacco: Never Used     Comment: hookah  . Alcohol use Yes    Review of Systems  All other systems negative except as documented in the HPI. All pertinent positives and negatives as  reviewed in the HPI. ____________________________________________   PHYSICAL EXAM:  VITAL SIGNS: ED Triage Vitals [07/05/17 1201]  Enc Vitals Group     BP (!) 138/93     Pulse Rate 60     Resp 18     Temp 98.7 F (37.1 C)     Temp Source Oral     SpO2 99 %     Weight      Height      Head Circumference      Peak Flow      Pain Score 5     Pain Loc      Pain Edu?      Excl. in GC?     Constitutional: Alert and oriented. Well appearing and in no acute distress. Eyes: Conjunctivae are normal. PERRL. EOMI. Head: Atraumatic. ose: No congestion/rhinnorhea. Mouth/Throat: Mucous membranes are moist.  Oropharynx non-erythematous. Neck: No stridor.  No meningeal signs.   Cardiovascular: Normal rate, regular rhythm. Good peripheral circulation. Grossly normal heart sounds.   Respiratory: Normal respiratory effort.  No retractions. Lungs CTAB. Gastrointestinal: Soft and nontender. No distention.  Musculoskeletal: No lower extremity tenderness nor edema. No gross deformities of extremities. Neurologic:  Normal speech and language. No gross focal neurologic deficits are appreciated.  Skin:  Skin is warm, dry and intact. Mild excoriation to left insuinal fold  ____________________________________________   LABS (all labs ordered are listed, but only abnormal results  are displayed)  Labs Reviewed  URINALYSIS, ROUTINE W REFLEX MICROSCOPIC - Abnormal; Notable for the following:       Result Value   Ketones, ur 20 (*)    All other components within normal limits  RPR  HIV ANTIBODY (ROUTINE TESTING)  GC/CHLAMYDIA PROBE AMP ([place] NOT [facility]   ____________________________________________  RADIOLOGY  Ct Head Wo Contrast  Result Date: 07/05/2017 CLINICAL DATA:  Headache for 2 weeks. EXAM: CT HEAD WITHOUT CONTRAST TECHNIQUE: Contiguous axial images were obtained from the base of the skull through the vertex without intravenous contrast. COMPARISON:  None. FINDINGS: Brain:  No evidence of acute infarction, hemorrhage, hydrocephalus, extra-axial collection or mass lesion/mass effect. Vascular: No hyperdense vessel or unexpected calcification. Skull: Normal. Negative for fracture or focal lesion. Sinuses/Orbits: No acute finding. Other: None. IMPRESSION: Normal head CT. Electronically Signed   By: Lupita RaiderJames  Green Jr, M.D.   On: 07/05/2017 13:40    ____________________________________________   PROCEDURES  Procedure(s) performed:   Procedures   ____________________________________________   INITIAL IMPRESSION / ASSESSMENT AND PLAN / ED COURSE  Pertinent labs & imaging results that were available during my care of the patient were reviewed by me and considered in my medical decision making (see chart for details).  Worsening AM headaches concerning for possible mass, will CT, otherwise follow up with neurology.  Will treat and eval for STD with high risk history.   Workup negative. Will treat rash with nystatin. Prophylactic abx given as well.   ____________________________________________  FINAL CLINICAL IMPRESSION(S) / ED DIAGNOSES  Final diagnoses:  Exposure to STD  Nonintractable headache, unspecified chronicity pattern, unspecified headache type  Yeast infection     MEDICATIONS GIVEN DURING THIS VISIT:  Medications  azithromycin (ZITHROMAX) powder 1 g (1 g Oral Given 07/05/17 1406)  cefTRIAXone (ROCEPHIN) injection 250 mg (250 mg Intramuscular Given 07/05/17 1407)     NEW OUTPATIENT MEDICATIONS STARTED DURING THIS VISIT:  Discharge Medication List as of 07/05/2017  2:23 PM    START taking these medications   Details  nystatin-triamcinolone (MYCOLOG II) cream Apply to left thigh area daily until rash clears, Print        Note:  This document was prepared using Dragon voice recognition software and may include unintentional dictation errors.   Marily MemosMesner, Rilda Bulls, MD 07/05/17 [id number]

## 2017-07-05 NOTE — ED Triage Notes (Signed)
Pt reports daily headaches, denies n/v, reports some intermittent blurred vision, denies at this time. Pt also reports dysuria, c/o "itchiy place" in fold of groin. No know exposure to STD, asking to be screened for STD and HIV.

## 2017-07-06 LAB — RPR: RPR Ser Ql: NONREACTIVE

## 2017-07-06 LAB — GC/CHLAMYDIA PROBE AMP (~~LOC~~) NOT AT ARMC
CHLAMYDIA, DNA PROBE: NEGATIVE
NEISSERIA GONORRHEA: NEGATIVE

## 2017-07-06 LAB — HIV ANTIBODY (ROUTINE TESTING W REFLEX): HIV Screen 4th Generation wRfx: NONREACTIVE

## 2017-08-31 ENCOUNTER — Encounter (HOSPITAL_COMMUNITY): Payer: Self-pay | Admitting: Student

## 2017-08-31 ENCOUNTER — Emergency Department (HOSPITAL_COMMUNITY)
Admission: EM | Admit: 2017-08-31 | Discharge: 2017-08-31 | Disposition: A | Discharge status: Home / Self Care | Payer: Self-pay | Attending: Emergency Medicine | Admitting: Emergency Medicine

## 2017-08-31 ENCOUNTER — Emergency Department (HOSPITAL_COMMUNITY): Payer: Self-pay

## 2017-08-31 ENCOUNTER — Other Ambulatory Visit: Payer: Self-pay

## 2017-08-31 DIAGNOSIS — M79644 Pain in right finger(s): Secondary | ICD-10-CM | POA: Insufficient documentation

## 2017-08-31 DIAGNOSIS — Z79899 Other long term (current) drug therapy: Secondary | ICD-10-CM | POA: Insufficient documentation

## 2017-08-31 DIAGNOSIS — F17200 Nicotine dependence, unspecified, uncomplicated: Secondary | ICD-10-CM | POA: Insufficient documentation

## 2017-08-31 NOTE — ED Triage Notes (Signed)
Pt reports his left thumb has been swelling off and on after playing football. This past week while playing football he heard something pop and now feels like he cant bend his thumb completely.

## 2017-08-31 NOTE — Discharge Instructions (Signed)
Please read and follow all provided instructions.  You have been seen today for pain in your right thumb  Tests performed today include: An x-ray of the affected area - does NOT show any broken bones or dislocations.  Vital signs. See below for your results today.   You will need to go to the drugstore and asked the pharmacist to show you where the thumb spica braces are.  Purchase 1 of these and wear it until seeing hand specialist.   Home care instructions: -- *PRICE in the first 24-48 hours after injury: Protect - with splint from drug store Rest Ice- Do not apply ice pack directly to your skin, place towel or similar between your skin and ice/ice pack. Apply ice for 20 min, then remove for 40 min while awake Compression- Wear brace, elastic bandage, splint as directed by your provider Elevate affected extremity above the level of your heart when not walking around for the first 24-48 hours   Use Ibuprofen (Motrin/Advil) 600mg  every 6 hours as needed for pain (do not exceed max dose in 24 hours, 2400mg )  Follow-up instructions: Please follow-up with your primary care provider or the provided hand specialist physician (bone specialist) if you continue to have significant pain in 1 week. In this case you may have a more severe injury that requires further care.   Return instructions:  Please return if your hand or fingers are numb or tingling, appear gray or blue, or you have severe pain (also elevate the hand and loosen splint or wrap if you were given one) Please return to the Emergency Department if you experience worsening symptoms.  Please return if you have any other emergent concerns. Additional Information:  Your vital signs today were: BP 125/77 (BP Location: Right Arm)    Pulse 70    Temp 98.8 F (37.1 C) (Oral)    Resp 16    SpO2 100%  If your blood pressure (BP) was elevated above 135/85 this visit, please have this repeated by your doctor within one  month. ---------------

## 2017-08-31 NOTE — ED Provider Notes (Signed)
[facility] Note   CSN: [id number] Arrival date & time: 08/31/17  1123     History   Chief Complaint Chief Complaint  Patient presents with  . Finger Injury    HPI Casey Lawson is a 31 y.o. male who presents to the emergency department complaining of intermittent right thumb pain for the past 3-4 months.  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  Has had several injuries to the thumb.  Most recently was 6 days prior states that he tackled someone then fell onto his hand with his thumb tucked below his other fingers.  Has been having pain at the base of the thumb as well as swelling.  States this is intermittent.  Describes it as an achy pain.  Has been taking Goody powder with relief.  Denies numbness, weakness, tingling, or any other injury. Patient is R hand dominant.   HPI  Past Medical History:  Diagnosis Date  . Knee pain, chronic   . Migraine     Patient Active Problem List   Diagnosis Date Noted  . TOOTH BROKEN FRACTURE DUE TO TRAUMA COMPLICATED 12/17/2009    Past Surgical History:  Procedure Laterality Date  . arm surgery Left        Home Medications    Prior to Admission medications   Medication Sig Start Date End Date Taking? Authorizing Provider  clotrimazole (LOTRIMIN) 1 % cream Apply to affected area 2 times daily 05/14/17   Graciella FreerLayden, Lindsey A, PA-C  cyclobenzaprine (FLEXERIL) 10 MG tablet Take 1 tablet (10 mg total) by mouth 2 (two) times daily as needed for muscle spasms. 11/12/14   Elson AreasSofia, Leslie K, PA-C  ibuprofen (ADVIL,MOTRIN) 800 MG tablet Take 1 tablet (800 mg total) by mouth 3 (three) times daily. 11/12/14   Elson AreasSofia, Leslie K, PA-C  naproxen (NAPROSYN) 375 MG tablet Take 1 tablet (375 mg total) by mouth 2 (two) times daily with a meal. 11/27/14   Marlon PelGreene, Tiffany, PA-C  nystatin-triamcinolone [facility](MYCOLOG II) cream Apply to left thigh area daily until rash clears 07/05/17   Mesner, Barbara CowerJason,  MD  sulfamethoxazole-trimethoprim (BACTRIM) 400-80 MG per tablet Take 1 tablet by mouth 2 (two) times daily. 01/07/13   Elpidio AnisUpstill, Shari, PA-C    Family History History reviewed. No pertinent family history.  Social History Social History   Tobacco Use  . Smoking status: Light Tobacco Smoker  . Smokeless tobacco: Never Used  . Tobacco comment: hookah  Substance Use Topics  . Alcohol use: Yes  . Drug use: Yes    Types: Marijuana     Allergies   Patient has no known allergies.   Review of Systems Review of Systems  Constitutional: Negative for chills and fever.  Musculoskeletal: Positive for arthralgias (base of R thumb).  Neurological: Negative for weakness and numbness.     Physical Exam Updated Vital Signs BP 125/77 (BP Location: Right Arm)   Pulse 70   Temp 98.8 F (37.1 C) (Oral)   Resp 16   SpO2 100%   Physical Exam  Constitutional: He appears well-developed and well-nourished. No distress.  HENT:  Head: Normocephalic and atraumatic.  Eyes: Conjunctivae are normal. Right eye exhibits no discharge. Left eye exhibits no discharge.  Cardiovascular:  Pulses:      Radial pulses are 2+ on the right side, and 2+ on the left side.  Musculoskeletal:  R hand: There is mild swelling over the MCP joint with tenderness to palpation  in this area extending proximally to first metacarpal.  There is no erythema, warmth, fluctuance, or open wounds.  Patient unable to completely flex the thumb, but has fairly good range of motion, minimal difference compared to L thumb.  Otherwise there is full range of motion of all joints of digits and wrist at all joints.  No other tenderness other than what is noted above, no snuffbox tenderness specifically.  Neurological: He is alert.  Clear speech. 5/5 grip strength bilaterally. Sensation intact to sharp and dull touch to bilateral upper extremities. Median/radial/ulnar nerve function intact with OK sign, thumbs up, and crossing of 2nd/3rd  digits.   Skin: Capillary refill takes less than 2 seconds.  Psychiatric: He has a normal mood and affect. His behavior is normal. Thought content normal.  Nursing note and vitals reviewed.  ED Treatments / Results  Labs (all labs ordered are listed, but only abnormal results are displayed) Labs Reviewed - No data to display  EKG  EKG Interpretation None      Radiology Dg Finger Thumb Right  Result Date: 08/31/2017 CLINICAL DATA:  Thumb pain and swelling. EXAM: RIGHT THUMB 2+V COMPARISON:  No recent. FINDINGS: No acute bony or joint abnormality identified. No evidence of fracture or dislocation. IMPRESSION: No acute abnormality . Electronically Signed   By: Maisie Fushomas  Register   On: 08/31/2017 12:42   Procedures Procedures (including critical care time)  Medications Ordered in ED Medications - No data to display   Initial Impression / Assessment and Plan / ED Course  I have reviewed the triage vital signs and the nursing notes.  Pertinent labs & imaging results that were available during my care of the patient were reviewed by me and considered in my medical decision making (see chart for details).   Patient presents with intermittent thumb pain for the past 3-4 months, improved by goodie powder at home. Patient is nontoxic appearing, in no apparent distress, with stable vitals.  NVI distally, no snuffbox tenderness. Patient X-Ray negative for obvious fracture or dislocation. Pt advised to follow up with hand specialist if symptoms persist for possibility of missed fracture diagnosis and for further evaluation. Patient instructed to get thumb spica splint from drug store that can be easily applied and removed. Recommended conservative therapy with continued goodie powder as well as PRICE and tylenol. I discussed results, treatment plan, need for hand surgeon follow-up, and return precautions with the patient. Provided opportunity for questions, patient confirmed understanding and is in  agreement with plan.   Final Clinical Impressions(s) / ED Diagnoses   Final diagnoses:  Pain of right thumb    ED Discharge Orders    None       Cherly Andersonetrucelli, Jrake Rodriquez R, PA-C 08/31/17 1319    Margarita Grizzleay, Danielle, MD 09/02/17 1243

## 2017-10-07 ENCOUNTER — Emergency Department (HOSPITAL_COMMUNITY)
Admission: EM | Admit: 2017-10-07 | Discharge: 2017-10-08 | Disposition: A | Discharge status: Home / Self Care | Payer: Self-pay | Attending: Emergency Medicine | Admitting: Emergency Medicine

## 2017-10-07 ENCOUNTER — Other Ambulatory Visit: Payer: Self-pay

## 2017-10-07 ENCOUNTER — Encounter (HOSPITAL_COMMUNITY): Payer: Self-pay

## 2017-10-07 ENCOUNTER — Emergency Department (HOSPITAL_COMMUNITY): Payer: Self-pay

## 2017-10-07 DIAGNOSIS — M79645 Pain in left finger(s): Secondary | ICD-10-CM

## 2017-10-07 DIAGNOSIS — Y92321 Football field as the place of occurrence of the external cause: Secondary | ICD-10-CM | POA: Insufficient documentation

## 2017-10-07 DIAGNOSIS — F17200 Nicotine dependence, unspecified, uncomplicated: Secondary | ICD-10-CM | POA: Insufficient documentation

## 2017-10-07 DIAGNOSIS — W010XXA Fall on same level from slipping, tripping and stumbling without subsequent striking against object, initial encounter: Secondary | ICD-10-CM | POA: Insufficient documentation

## 2017-10-07 DIAGNOSIS — M79644 Pain in right finger(s): Secondary | ICD-10-CM

## 2017-10-07 DIAGNOSIS — Y9361 Activity, american tackle football: Secondary | ICD-10-CM | POA: Insufficient documentation

## 2017-10-07 DIAGNOSIS — S63636A Sprain of interphalangeal joint of right little finger, initial encounter: Secondary | ICD-10-CM | POA: Insufficient documentation

## 2017-10-07 DIAGNOSIS — Y998 Other external cause status: Secondary | ICD-10-CM | POA: Insufficient documentation

## 2017-10-07 MED ORDER — IBUPROFEN 800 MG PO TABS
800.0000 mg | ORAL_TABLET | Freq: Once | ORAL | Status: AC
  Administered 2017-10-07: 800 mg via ORAL
  Filled 2017-10-07: qty 1

## 2017-10-07 MED ORDER — IBUPROFEN 800 MG PO TABS: 800.0000 mg | ORAL_TABLET | Freq: Three times a day (TID) | ORAL | 0 refills | Status: DC | PRN

## 2017-10-07 MED ORDER — CYCLOBENZAPRINE HCL 10 MG PO TABS: 10.0000 mg | ORAL_TABLET | Freq: Two times a day (BID) | ORAL | 0 refills | Status: DC | PRN

## 2017-10-07 NOTE — ED Provider Notes (Signed)
[facility]-EMERGENCY DEPT Provider Note   CSN: [id number] Arrival date & time: 10/07/17  2134     History   Chief Complaint Chief Complaint  Patient presents with  . Hand Pain    HPI Philipe Laswell is a 32 y.o. male.  HPI   32 year old male presenting for evaluation pain in his thumb.  Patient report a month ago he was playing football when he fell.  He is then both of his hand to break the fall and since then he has been having pain to both of his thumbs.  Pain initially worse on the right thumb but now left thumb is more tender.  Pain is described as a sharp sensation to the base of his thumb worsening with movement of the thumb.  He has been taking BC powder with some improvement.  He was playing football again today when he jammed his right pinky finger.  Complaining of 8 out of 10 sharp pain to the finger with increasing pain with movement.  No associated numbness.  Still endorse pain to his thumb.  Denies any pain to his wrist or elbow.  No specific treatment tried.  He is right-hand dominant.  Past Medical History:  Diagnosis Date  . Knee pain, chronic   . Migraine     Patient Active Problem List   Diagnosis Date Noted  . TOOTH BROKEN FRACTURE DUE TO TRAUMA COMPLICATED 12/17/2009    Past Surgical History:  Procedure Laterality Date  . arm surgery Left        Home Medications    Prior to Admission medications   Medication Sig Start Date End Date Taking? Authorizing Provider  clotrimazole (LOTRIMIN) 1 % cream Apply to affected area 2 times daily 05/14/17   Graciella Freer A, PA-C  cyclobenzaprine (FLEXERIL) 10 MG tablet Take 1 tablet (10 mg total) by mouth 2 (two) times daily as needed for muscle spasms. 11/12/14   Elson Areas, PA-C  ibuprofen (ADVIL,MOTRIN) 800 MG tablet Take 1 tablet (800 mg total) by mouth 3 (three) times daily. 11/12/14   Elson Areas, PA-C  naproxen (NAPROSYN) 375 MG tablet Take 1 tablet (375 mg total) by mouth 2 (two)  times daily with a meal. 11/27/14   Marlon Pel, PA-C  nystatin-triamcinolone [facility] II) cream Apply to left thigh area daily until rash clears 07/05/17   Mesner, Barbara Cower, MD  sulfamethoxazole-trimethoprim (BACTRIM) 400-80 MG per tablet Take 1 tablet by mouth 2 (two) times daily. 01/07/13   Elpidio Anis, PA-C    Family History History reviewed. No pertinent family history.  Social History Social History   Tobacco Use  . Smoking status: Light Tobacco Smoker  . Smokeless tobacco: Never Used  . Tobacco comment: hookah  Substance Use Topics  . Alcohol use: Yes  . Drug use: Yes    Types: Marijuana     Allergies   Patient has no known allergies.   Review of Systems Review of Systems  Constitutional: Negative for fever.  Musculoskeletal: Positive for arthralgias and joint swelling.  Skin: Negative for rash and wound.  Neurological: Negative for numbness.     Physical Exam Updated Vital Signs BP 120/77 (BP Location: Right Arm)   Pulse 60   Temp (!) 97.4 F (36.3 C) (Oral)   Resp 18   Ht 5\' 8"  (1.727 m)   Wt 68 kg (150 lb)   SpO2 99%   BMI 22.81 kg/m   Physical Exam  Constitutional: He appears well-developed and well-nourished. No  distress.  HENT:  Head: Atraumatic.  Eyes: Conjunctivae are normal.  Neck: Neck supple.  Musculoskeletal: He exhibits tenderness (Right hand: On the right thumb patient has tenderness along the MCP ulnar aspect.  Thumb with full range of motion, no bruising or crepitus noted.  Brisk cap refill.  Increasing pain with thumb abduction).  Right pinky finger: Tenderness along the pinky finger most significant to the distal phalanx and the PIP with mild swelling and redness noted.  No crepitus.  Brisk cap refill.  Decreased flexion at the DIP joint.  Left thumb: Tenderness to the base of the thumb without any deformity or crepitus.  No overlying skin changes.  Full range of motion throughout thumb with brisk cap refill.  Neurological: He is  alert.  Skin: No rash noted.  Psychiatric: He has a normal mood and affect.  Nursing note and vitals reviewed.    ED Treatments / Results  Labs (all labs ordered are listed, but only abnormal results are displayed) Labs Reviewed - No data to display  EKG  EKG Interpretation None       Radiology Dg Hand Complete Right  Result Date: 10/07/2017 CLINICAL DATA:  Pain to the thumb and pinky after playing football EXAM: RIGHT HAND - COMPLETE 3+ VIEW COMPARISON:  08/31/2017 FINDINGS: Probable old fracture deformity of the distal fifth metacarpal. No acute displaced fracture or malalignment is seen. The soft tissues are unremarkable IMPRESSION: No acute osseous abnormality Electronically Signed   By: Jasmine PangKim  Fujinaga M.D.   On: 10/07/2017 23:21    Procedures Procedures (including critical care time)  Medications Ordered in ED Medications  ibuprofen (ADVIL,MOTRIN) tablet 800 mg (800 mg Oral Given 10/07/17 2343)     Initial Impression / Assessment and Plan / ED Course  I have reviewed the triage vital signs and the nursing notes.  Pertinent labs & imaging results that were available during my care of the patient were reviewed by me and considered in my medical decision making (see chart for details).     BP 120/77 (BP Location: Right Arm)   Pulse 60   Temp (!) 97.4 F (36.3 C) (Oral)   Resp 18   Ht 5\' 8"  (1.727 m)   Wt 68 kg (150 lb)   SpO2 99%   BMI 22.81 kg/m    Final Clinical Impressions(s) / ED Diagnoses   Final diagnoses:  Sprain of interphalangeal joint of right little finger, initial encounter  Bilateral thumb pain    ED Discharge Orders        Ordered    cyclobenzaprine (FLEXERIL) 10 MG tablet  2 times daily PRN     10/07/17 2351    ibuprofen (ADVIL,MOTRIN) 800 MG tablet  Every 8 hours PRN     10/07/17 2351     11:12 PM Patient here with bilateral thumb pain for the past month but now having injuring his right pinky finger while playing football which  prompting him to come here.  He does have some swelling noted to the distal phalanx of his right pinky finger as well as tenderness to the base of bilateral thumb.  Will obtain x-ray of the right hand, ibuprofen given for pain.  11:52 PM X-ray of her right hand without any acute fractures or dislocation.  Likely this a sprain involving his right pinky finger.  Finger splint provided for support.  Encourage patient to follow-up with hand specialist for further evaluation of his recurrent bilateral thumb pain.  Rice therapy discussed.  Patient  sent home with muscle relaxant and NSAIDs.   Fayrene Helper, PA-C 10/07/17 Arletha Grippe    Geoffery Lyons, MD 10/08/17 0530

## 2017-10-07 NOTE — ED Triage Notes (Signed)
States thumb pain for months, and today playing football and now right little pinky finger pain no deformity noted good capillary refill less than 2 seconds.

## 2018-07-05 ENCOUNTER — Other Ambulatory Visit: Payer: Self-pay

## 2018-07-05 ENCOUNTER — Encounter (HOSPITAL_COMMUNITY): Payer: Self-pay

## 2018-07-05 ENCOUNTER — Emergency Department (HOSPITAL_COMMUNITY)
Admission: EM | Admit: 2018-07-05 | Discharge: 2018-07-05 | Disposition: A | Discharge status: Home / Self Care | Payer: Self-pay | Attending: Emergency Medicine | Admitting: Emergency Medicine

## 2018-07-05 DIAGNOSIS — F1210 Cannabis abuse, uncomplicated: Secondary | ICD-10-CM | POA: Insufficient documentation

## 2018-07-05 DIAGNOSIS — R509 Fever, unspecified: Secondary | ICD-10-CM | POA: Insufficient documentation

## 2018-07-05 DIAGNOSIS — J029 Acute pharyngitis, unspecified: Secondary | ICD-10-CM | POA: Insufficient documentation

## 2018-07-05 DIAGNOSIS — R51 Headache: Secondary | ICD-10-CM | POA: Insufficient documentation

## 2018-07-05 DIAGNOSIS — F17200 Nicotine dependence, unspecified, uncomplicated: Secondary | ICD-10-CM | POA: Insufficient documentation

## 2018-07-05 LAB — GROUP A STREP BY PCR: Group A Strep by PCR: NOT DETECTED

## 2018-07-05 MED ORDER — LIDOCAINE VISCOUS HCL 2 % MT SOLN: 15.0000 mL | OROMUCOSAL | 0 refills | Status: DC | PRN

## 2018-07-05 MED ORDER — IBUPROFEN 400 MG PO TABS
600.0000 mg | ORAL_TABLET | Freq: Once | ORAL | Status: AC
  Administered 2018-07-05: 09:00:00 600 mg via ORAL
  Filled 2018-07-05: qty 1

## 2018-07-05 MED ORDER — IBUPROFEN 600 MG PO TABS: 600.0000 mg | ORAL_TABLET | Freq: Four times a day (QID) | ORAL | 0 refills | Status: DC | PRN

## 2018-07-05 MED ORDER — LIDOCAINE VISCOUS HCL 2 % MT SOLN
15.0000 mL | Freq: Once | OROMUCOSAL | Status: AC
  Administered 2018-07-05: 15 mL via OROMUCOSAL
  Filled 2018-07-05: qty 15

## 2018-07-05 NOTE — ED Provider Notes (Signed)
[facility] EMERGENCY DEPARTMENT Provider Note   CSN: [id number] Arrival date & time: 07/05/18  0841     History   Chief Complaint Chief Complaint  Patient presents with  . Sore Throat    HPI Casey Lawson is a 32 y.o. male with no pertinent past medical history who presents to the emergency department with a chief complaint of bilateral sore throat, left greater than right x2 days.  He reports he had a constant sore throat several weeks ago that went away prior to the onset of his symptoms 2 days ago that significantly worsened last night.  He had difficulty sleeping due to the pain.  He reports associated headache, subjective fever, and chills.  States he had a formed who he shared a marijuana vape with around the time she was diagnosed with strep throat recently.    Pain is worse with swallowing.  No known alleviating factors.  He denies fatigue, dysphagia, drooling, trismus, cough, muffled voice, facial or neck swelling, chest pain, dyspnea, dental pain, or abdominal pain.  No treatment prior to arrival.  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  The history is provided by the patient. No language interpreter was used.  Sore Throat  This is a recurrent problem. The current episode started 2 days ago. The problem occurs constantly. The problem has been rapidly worsening. Pertinent negatives include no chest pain, no abdominal pain and no shortness of breath. The symptoms are aggravated by swallowing. Nothing relieves the symptoms. He has tried nothing for the symptoms.    Past Medical History:  Diagnosis Date  . Knee pain, chronic   . Migraine     Patient Active Problem List   Diagnosis Date Noted  . TOOTH BROKEN FRACTURE DUE TO TRAUMA COMPLICATED 12/17/2009    Past Surgical History:  Procedure Laterality Date  . arm surgery Left         Home Medications    Prior to Admission medications   Medication Sig Start Date End Date Taking? Authorizing  Provider  clotrimazole (LOTRIMIN) 1 % cream Apply to affected area 2 times daily 05/14/17   Graciella Freer A, PA-C  cyclobenzaprine (FLEXERIL) 10 MG tablet Take 1 tablet (10 mg total) by mouth 2 (two) times daily as needed for muscle spasms. 10/07/17   Fayrene Helper, PA-C  ibuprofen (ADVIL,MOTRIN) 600 MG tablet Take 1 tablet (600 mg total) by mouth every 6 (six) hours as needed. 07/05/18   Davonne Jarnigan A, PA-C  lidocaine (XYLOCAINE) 2 % solution Use as directed 15 mLs in the mouth or throat as needed for mouth pain. 07/05/18   Medard Decuir A, PA-C  naproxen (NAPROSYN) 375 MG tablet Take 1 tablet (375 mg total) by mouth 2 (two) times daily with a meal. 11/27/14   Marlon Pel, PA-C  nystatin-triamcinolone [facility] II) cream Apply to left thigh area daily until rash clears 07/05/17   Mesner, Barbara Cower, MD  sulfamethoxazole-trimethoprim (BACTRIM) 400-80 MG per tablet Take 1 tablet by mouth 2 (two) times daily. 01/07/13   Elpidio Anis, PA-C    Family History History reviewed. No pertinent family history.  Social History Social History   Tobacco Use  . Smoking status: Light Tobacco Smoker  . Smokeless tobacco: Never Used  . Tobacco comment: hookah  Substance Use Topics  . Alcohol use: Yes  . Drug use: Yes    Types: Marijuana     Allergies   Patient has no known allergies.   Review of Systems Review of  Systems  Constitutional: Positive for chills and fever. Negative for activity change and fatigue.  HENT: Positive for sore throat. Negative for congestion, trouble swallowing and voice change.   Respiratory: Negative for shortness of breath.   Cardiovascular: Negative for chest pain.  Gastrointestinal: Negative for abdominal pain.  Musculoskeletal: Negative for back pain.  Skin: Negative for rash.     Physical Exam Updated Vital Signs BP (!) 124/91 (BP Location: Right Arm)   Pulse 70   Temp 99.5 F (37.5 C) (Oral)   Resp 20   Ht 5\' 8"  (1.727 m)   Wt 79.4 kg   SpO2 99%   BMI  26.61 kg/m   Physical Exam  Constitutional: He appears well-developed.  HENT:  Head: Normocephalic.  Right Ear: Hearing and tympanic membrane normal.  Left Ear: Hearing, tympanic membrane and external ear normal.  Nose: Nose normal. Right sinus exhibits no maxillary sinus tenderness and no frontal sinus tenderness. Left sinus exhibits no maxillary sinus tenderness and no frontal sinus tenderness.  Mouth/Throat: Uvula is midline and mucous membranes are normal. No trismus in the jaw. Posterior oropharyngeal erythema present. No oropharyngeal exudate, posterior oropharyngeal edema or tonsillar abscesses. Tonsils are 2+ on the right. Tonsils are 2+ on the left. Tonsillar exudate.  White patchy exudates on the bilateral tonsils.  Tonsils are 2+, but symmetric.  Uvula is midline.  No sublingual tenderness or induration.  Eyes: Conjunctivae are normal. Right eye exhibits no discharge. Left eye exhibits no discharge. No scleral icterus.  Neck: Normal range of motion. Neck supple. No tracheal deviation present.  Cardiovascular: Normal rate, regular rhythm, normal heart sounds and intact distal pulses. Exam reveals no gallop and no friction rub.  No murmur heard. Pulmonary/Chest: Effort normal. No stridor. No respiratory distress. He has no wheezes. He has no rales. He exhibits no tenderness.  Abdominal: Soft. He exhibits no distension.  Lymphadenopathy:    He has cervical adenopathy.  Neurological: He is alert.  Skin: Skin is warm and dry.  Psychiatric: His behavior is normal.  Nursing note and vitals reviewed.    ED Treatments / Results  Labs (all labs ordered are listed, but only abnormal results are displayed) Labs Reviewed  GROUP A STREP BY PCR    EKG None  Radiology No results found.  Procedures Procedures (including critical care time)  Medications Ordered in ED Medications  ibuprofen (ADVIL,MOTRIN) tablet 600 mg (600 mg Oral Given 07/05/18 0924)  lidocaine (XYLOCAINE) 2 %  viscous mouth solution 15 mL (15 mLs Mouth/Throat Given 07/05/18 0925)     Initial Impression / Assessment and Plan / ED Course  I have reviewed the triage vital signs and the nursing notes.  Pertinent labs & imaging results that were available during my care of the patient were reviewed by me and considered in my medical decision making (see chart for details).     32 year old male presenting with sore throat for 2 days with subjective fever and chills.  No cough.  He had recent exposure where he shared a marijuana vape with a friend who was recently treated for strep throat.  The patient was counseled on health concerns with vaping marijuana.  On exam, he has bilateral tonsillar exudates.  Doubt PTA, retropharyngeal abscess, or Ludwig's angina.  Will check strep PCR.  He was given viscous lidocaine and ibuprofen in the ED for pain.  Although sore throat is recurrent, he denies fatigue and a lower suspicion for mononucleosis given age, occupation, and history of present illness.  Strep PCR is negative.  Will discharge with symptomatic treatment as viral etiology is suspected.  Recommend follow-up with PCP if symptoms do not resolve within the next week.  He is hemodynamically stable and in no acute distress.  He is safe for discharge to home with outpatient follow-up at this time.  Final Clinical Impressions(s) / ED Diagnoses   Final diagnoses:  Viral pharyngitis    ED Discharge Orders         Ordered    ibuprofen (ADVIL,MOTRIN) 600 MG tablet  Every 6 hours PRN     07/05/18 1030    lidocaine (XYLOCAINE) 2 % solution  As needed     07/05/18 1030           Modean Mccullum A, PA-C 07/05/18 1034    Blane Ohara, MD 07/06/18 2351

## 2018-07-05 NOTE — Discharge Instructions (Signed)
Thank you for allowing me to care for you today in the Emergency Department.   Your test today was negative for strep throat.  Your sore throat is viral, which does not require antibiotics, but is still contagious.  Make sure you wash her hands frequently.  To treat your symptoms at home, take 600 mg of ibuprofen with food every 6 hours for pain and fever.  You can also take 650 mg of Tylenol every 6 hours or alternate between these 2 medications every 3 hours if needed.  Do not use Goody powder, BC powder, other NSAIDs, Aleve, or Motrin while taking ibuprofen.   You can swallow 15 mL of viscous lidocaine every 3 hours as needed for sore throat.  Eating and drinking hot and cold beverages and food may help with pain as compared to things of room temperature.  Return to the emergency department if you become unable to open your mouth, if you become unable to swallow and have drooling of your mouth, feel as if your throat is closing, have difficulty breathing, or severe swelling to one side of your face or neck.  If your symptoms do not improve in the next week, call the number on your discharge paperwork to get established with a primary care provider.

## 2018-07-05 NOTE — ED Triage Notes (Signed)
Pt states he has had sore throat for several days. Friend recently had strep thinks it could be same.

## 2018-07-05 NOTE — ED Notes (Signed)
ED Provider at bedside. 

## 2019-05-15 ENCOUNTER — Emergency Department (HOSPITAL_COMMUNITY): Payer: Self-pay

## 2019-05-15 ENCOUNTER — Other Ambulatory Visit: Payer: Self-pay

## 2019-05-15 ENCOUNTER — Emergency Department (HOSPITAL_COMMUNITY)
Admission: EM | Admit: 2019-05-15 | Discharge: 2019-05-15 | Disposition: A | Discharge status: Home / Self Care | Payer: Self-pay | Attending: Emergency Medicine | Admitting: Emergency Medicine

## 2019-05-15 DIAGNOSIS — Y9361 Activity, american tackle football: Secondary | ICD-10-CM | POA: Insufficient documentation

## 2019-05-15 DIAGNOSIS — Y999 Unspecified external cause status: Secondary | ICD-10-CM | POA: Insufficient documentation

## 2019-05-15 DIAGNOSIS — S0083XA Contusion of other part of head, initial encounter: Secondary | ICD-10-CM | POA: Insufficient documentation

## 2019-05-15 DIAGNOSIS — Z79899 Other long term (current) drug therapy: Secondary | ICD-10-CM | POA: Insufficient documentation

## 2019-05-15 DIAGNOSIS — W51XXXA Accidental striking against or bumped into by another person, initial encounter: Secondary | ICD-10-CM | POA: Insufficient documentation

## 2019-05-15 DIAGNOSIS — F17200 Nicotine dependence, unspecified, uncomplicated: Secondary | ICD-10-CM | POA: Insufficient documentation

## 2019-05-15 DIAGNOSIS — Y929 Unspecified place or not applicable: Secondary | ICD-10-CM | POA: Insufficient documentation

## 2019-05-15 MED ORDER — NAPROXEN 500 MG PO TABS: 500.0000 mg | ORAL_TABLET | Freq: Two times a day (BID) | ORAL | 0 refills | Status: DC

## 2019-05-15 NOTE — ED Provider Notes (Signed)
[facility] Provider Note   CSN: [id number] Arrival date & time: 05/15/19  1018     History   Chief Complaint Chief Complaint  Patient presents with   Jaw Pain    HPI Casey Lawson is a 33 y.o. male.     Patient presents the emergency department with complaint of cute onset of right-sided jaw pain starting 4 days ago.  Patient was playing football without pads or helmet and took an impact to the right jaw.  Patient did not lose consciousness.  He had immediate pain.  Patient complains of continued pain in that side, difficulty chewing.  Mild facial swelling reported.  He has been drinking fluids and eating applesauce.  He has been taking NSAIDs without much improvement.  Patient denies any neck pain, headache or other neurologic symptoms.  Course is constant.     Past Medical History:  Diagnosis Date   Knee pain, chronic    Migraine     Patient Active Problem List   Diagnosis Date Noted   TOOTH BROKEN FRACTURE DUE TO TRAUMA COMPLICATED 12/17/2009    Past Surgical History:  Procedure Laterality Date   arm surgery Left         Home Medications    Prior to Admission medications   Medication Sig Start Date End Date Taking? Authorizing Provider  clotrimazole (LOTRIMIN) 1 % cream Apply to affected area 2 times daily 05/14/17   Graciella FreerLayden, Lindsey A, PA-C  cyclobenzaprine (FLEXERIL) 10 MG tablet Take 1 tablet (10 mg total) by mouth 2 (two) times daily as needed for muscle spasms. 10/07/17   Fayrene Helperran, Bowie, PA-C  ibuprofen (ADVIL,MOTRIN) 600 MG tablet Take 1 tablet (600 mg total) by mouth every 6 (six) hours as needed. 07/05/18   McDonald, Mia A, PA-C  lidocaine (XYLOCAINE) 2 % solution Use as directed 15 mLs in the mouth or throat as needed for mouth pain. 07/05/18   McDonald, Mia A, PA-C  naproxen (NAPROSYN) 375 MG tablet Take 1 tablet (375 mg total) by mouth 2 (two) times daily with a meal. 11/27/14   Marlon PelGreene, Tiffany, PA-C    nystatin-triamcinolone [facility] II) cream Apply to left thigh area daily until rash clears 07/05/17   Mesner, Barbara CowerJason, MD  sulfamethoxazole-trimethoprim (BACTRIM) 400-80 MG per tablet Take 1 tablet by mouth 2 (two) times daily. 01/07/13   Elpidio AnisUpstill, Shari, PA-C    Family History No family history on file.  Social History Social History   Tobacco Use   Smoking status: Light Tobacco Smoker   Smokeless tobacco: Never Used   Tobacco comment: hookah  Substance Use Topics   Alcohol use: Yes   Drug use: Yes    Types: Marijuana     Allergies   Patient has no known allergies.   Review of Systems Review of Systems  Constitutional: Negative for fatigue.  HENT: Positive for facial swelling. Negative for dental problem and tinnitus.   Eyes: Negative for photophobia, pain and visual disturbance.  Respiratory: Negative for shortness of breath.   Cardiovascular: Negative for chest pain.  Gastrointestinal: Negative for nausea and vomiting.  Musculoskeletal: Negative for back pain, gait problem and neck pain.  Skin: Negative for wound.  Neurological: Negative for dizziness, weakness, light-headedness, numbness and headaches.  Psychiatric/Behavioral: Negative for confusion and decreased concentration.     Physical Exam Updated Vital Signs BP 123/81 (BP Location: Left Arm)    Pulse 61    Temp 98.2 F (36.8 C) (Oral)    Resp 18  SpO2 100%   Physical Exam Vitals signs and nursing note reviewed.  Constitutional:      Appearance: He is well-developed.  HENT:     Head: Normocephalic and atraumatic. No raccoon eyes or Battle's sign.     Right Ear: Tympanic membrane, ear canal and external ear normal. No hemotympanum.     Left Ear: Tympanic membrane, ear canal and external ear normal. No hemotympanum.     Nose: Nose normal.     Mouth/Throat:     Comments: Dentition intact.  Patient with point tenderness over the posterior aspect of the right angle of the jaw.  No TMJ tenderness.  No  deformity or step-off. Eyes:     General: Lids are normal.     Conjunctiva/sclera: Conjunctivae normal.     Pupils: Pupils are equal, round, and reactive to light.     Comments: No visible hyphema  Neck:     Musculoskeletal: Normal range of motion and neck supple.  Cardiovascular:     Rate and Rhythm: Normal rate and regular rhythm.  Pulmonary:     Effort: Pulmonary effort is normal.     Breath sounds: Normal breath sounds.  Abdominal:     Palpations: Abdomen is soft.     Tenderness: There is no abdominal tenderness.  Musculoskeletal: Normal range of motion.     Cervical back: He exhibits normal range of motion, no tenderness and no bony tenderness.     Thoracic back: He exhibits no tenderness and no bony tenderness.     Lumbar back: He exhibits no tenderness and no bony tenderness.  Skin:    General: Skin is warm and dry.  Neurological:     Mental Status: He is alert and oriented to person, place, and time.     GCS: GCS eye subscore is 4. GCS verbal subscore is 5. GCS motor subscore is 6.     Cranial Nerves: No cranial nerve deficit.     Sensory: No sensory deficit.     Coordination: Coordination normal.     Deep Tendon Reflexes: Reflexes are normal and symmetric.      ED Treatments / Results  Labs (all labs ordered are listed, but only abnormal results are displayed) Labs Reviewed - No data to display  EKG None  Radiology Ct Maxillofacial Wo Contrast  Result Date: 05/15/2019 CLINICAL DATA:  Jaw pain post blunt injury. EXAM: CT MAXILLOFACIAL WITHOUT CONTRAST TECHNIQUE: Multidetector CT imaging of the maxillofacial structures was performed. Multiplanar CT image reconstructions were also generated. COMPARISON:  None. FINDINGS: Osseous: No fracture or mandibular dislocation. No destructive process. Orbits: Negative. No traumatic or inflammatory finding. Sinuses: Clear. Soft tissues: Negative. Limited intracranial: No significant or unexpected finding. IMPRESSION: No evidence  of facial fractures. Electronically Signed   By: Fidela Salisbury M.D.   On: 05/15/2019 13:50    Procedures Procedures (including critical care time)  Medications Ordered in ED Medications - No data to display   Initial Impression / Assessment and Plan / ED Course  I have reviewed the triage vital signs and the nursing notes.  Pertinent labs & imaging results that were available during my care of the patient were reviewed by me and considered in my medical decision making (see chart for details).        Patient seen and examined.  Will need to evaluate for jaw fracture.  CT maxillofacial ordered.  Vital signs reviewed and are as follows: BP 123/81 (BP Location: Left Arm)    Pulse 61  Temp 98.2 F (36.8 C) (Oral)    Resp 18    SpO2 100%    2:01 PM CT reviewed by myself. No fx noted by radiologist.   Continue NSAIDs, ice as needed. Pt updated.    Final Clinical Impressions(s) / ED Diagnoses   Final diagnoses:  Contusion of jaw, initial encounter   Patient with jaw injury as described above.  Imaging performed today without any signs of jaw fracture.  Suspect contusion at this point.  No dental injuries.  Patient will continue conservative measures, NSAIDs, ice.  Encouraged to protect the area until healed.   ED Discharge Orders         Ordered    naproxen (NAPROSYN) 500 MG tablet  2 times daily     05/15/19 1357           Renne Crigler, PA-C 05/15/19 1403    Milagros Loll, MD 05/16/19 [phone]

## 2019-05-15 NOTE — Discharge Instructions (Signed)
Please read and follow all provided instructions.  Your diagnoses today include:  1. Contusion of jaw, initial encounter     Tests performed today include:  CT imaging of your jaw -does not show any fractures or other issues  Vital signs. See below for your results today.   Medications prescribed:   Naproxen - anti-inflammatory pain medication  Do not exceed 500mg  naproxen every 12 hours, take with food  You have been prescribed an anti-inflammatory medication or NSAID. Take with food. Take smallest effective dose for the shortest duration needed for your pain. Stop taking if you experience stomach pain or vomiting.   Take any prescribed medications only as directed.  Home care instructions:  Follow any educational materials contained in this packet.  Follow-up instructions: Please follow-up with your primary care provider as needed for further evaluation of your symptoms.   Return instructions:   Please return to the Emergency Department if you experience worsening symptoms.   Please return if you have any other emergent concerns.  Additional Information:  Your vital signs today were: BP 123/81 (BP Location: Left Arm)    Pulse 61    Temp 98.2 F (36.8 C) (Oral)    Resp 18    SpO2 100%  If your blood pressure (BP) was elevated above 135/85 this visit, please have this repeated by your doctor within one month. --------------

## 2019-05-15 NOTE — ED Triage Notes (Signed)
Patient reports R side jaw pain since Monday - states he caught a knee to the jaw and has had difficulty opening his mouth/eating since.

## 2019-05-15 NOTE — ED Notes (Signed)
Pt verbalizes understanding of admission, medications, and discharge instructions. Opportunity for questions and answers provided.

## 2019-05-21 ENCOUNTER — Other Ambulatory Visit: Payer: Self-pay | Admitting: Emergency Medicine

## 2019-05-21 DIAGNOSIS — Z20822 Contact with and (suspected) exposure to covid-19: Secondary | ICD-10-CM

## 2019-05-22 LAB — NOVEL CORONAVIRUS, NAA: SARS-CoV-2, NAA: NOT DETECTED

## 2020-02-25 ENCOUNTER — Other Ambulatory Visit: Payer: Self-pay

## 2020-02-25 ENCOUNTER — Encounter (HOSPITAL_COMMUNITY): Payer: Self-pay | Admitting: Emergency Medicine

## 2020-02-25 ENCOUNTER — Ambulatory Visit (HOSPITAL_COMMUNITY)
Admission: EM | Admit: 2020-02-25 | Discharge: 2020-02-25 | Disposition: A | Discharge status: Home / Self Care | Payer: Medicaid Other | Attending: Internal Medicine | Admitting: Internal Medicine

## 2020-02-25 DIAGNOSIS — Z113 Encounter for screening for infections with a predominantly sexual mode of transmission: Secondary | ICD-10-CM | POA: Diagnosis present

## 2020-02-25 NOTE — ED Triage Notes (Signed)
Pt states he just wanted to be checked for STD. Pt denies any symptoms.

## 2020-02-25 NOTE — ED Provider Notes (Signed)
[facility]    CSN: [id number] Arrival date & time: 02/25/20  1019      History   Chief Complaint Chief Complaint  Patient presents with  . SEXUALLY TRANSMITTED DISEASE    HPI Casey Lawson is a 34 y.o. male is here for STD screen.  No symptoms.Marland Kitchen   HPI  Past Medical History:  Diagnosis Date  . Knee pain, chronic   . Migraine     Patient Active Problem List   Diagnosis Date Noted  . TOOTH BROKEN FRACTURE DUE TO TRAUMA COMPLICATED 12/17/2009    Past Surgical History:  Procedure Laterality Date  . arm surgery Left        Home Medications    Prior to Admission medications   Not on File    Family History Family History  Problem Relation Age of Onset  . Healthy Mother   . Healthy Father     Social History Social History   Tobacco Use  . Smoking status: Light Tobacco Smoker  . Smokeless tobacco: Never Used  . Tobacco comment: hookah  Vaping Use  . Vaping Use: Never used  Substance Use Topics  . Alcohol use: Yes  . Drug use: Yes    Frequency: 7.0 times per week    Types: Marijuana     Allergies   Patient has no known allergies.   Review of Systems Review of Systems  Genitourinary: Negative for penile pain, penile swelling, scrotal swelling and testicular pain.     Physical Exam Triage Vital Signs ED Triage Vitals  Enc Vitals Group     BP 02/25/20 1042 121/85     Pulse Rate 02/25/20 1042 66     Resp 02/25/20 1042 15     Temp 02/25/20 1042 98.5 F (36.9 C)     Temp Source 02/25/20 1042 Oral     SpO2 02/25/20 1042 98 %     Weight --      Height --      Head Circumference --      Peak Flow --      Pain Score 02/25/20 1041 0     Pain Loc --      Pain Edu? --      Excl. in GC? --    No data found.  Updated Vital Signs BP 121/85 (BP Location: Right Arm)   Pulse 66   Temp 98.5 F (36.9 C) (Oral)   Resp 15   SpO2 98%   Visual Acuity Right Eye Distance:   Left Eye Distance:   Bilateral Distance:    Right Eye  Near:   Left Eye Near:    Bilateral Near:     Physical Exam Constitutional:      General: He is not in acute distress.    Appearance: Normal appearance. He is not ill-appearing.  Cardiovascular:     Rate and Rhythm: Normal rate and regular rhythm.  Genitourinary:    Penis: Normal.      Testes: Normal.  Neurological:     Mental Status: He is alert.      UC Treatments / Results  Labs (all labs ordered are listed, but only abnormal results are displayed) Labs Reviewed  CYTOLOGY, (ORAL, ANAL, URETHRAL) ANCILLARY ONLY    EKG   Radiology No results found.  Procedures Procedures (including critical care time)  Medications Ordered in UC Medications - No data to display  Initial Impression / Assessment and Plan / UC Course  I have reviewed the triage vital  signs and the nursing notes.  Pertinent labs & imaging results that were available during my care of the patient were reviewed by me and considered in my medical decision making (see chart for details).     1.  STD screening: Cytology for GC/chlamydia/trichomonas Patient has MyChart If labs shows any abnormalities we will modify the patient's plan of care. Final Clinical Impressions(s) / UC Diagnoses   Final diagnoses:  Screen for STD (sexually transmitted disease)   Discharge Instructions   None    ED Prescriptions    None     PDMP not reviewed this encounter.   Chase Picket, MD 02/25/20 1248

## 2020-02-26 LAB — CYTOLOGY, (ORAL, ANAL, URETHRAL) ANCILLARY ONLY
Chlamydia: NEGATIVE
Comment: NEGATIVE
Comment: NEGATIVE
Comment: NORMAL
Neisseria Gonorrhea: NEGATIVE
Trichomonas: NEGATIVE

## 2020-03-13 ENCOUNTER — Emergency Department (HOSPITAL_COMMUNITY)
Admission: EM | Admit: 2020-03-13 | Discharge: 2020-03-13 | Disposition: A | Discharge status: Home / Self Care | Payer: Medicaid Other | Attending: Emergency Medicine | Admitting: Emergency Medicine

## 2020-03-13 ENCOUNTER — Other Ambulatory Visit: Payer: Self-pay

## 2020-03-13 ENCOUNTER — Emergency Department (HOSPITAL_COMMUNITY): Payer: Medicaid Other

## 2020-03-13 ENCOUNTER — Encounter (HOSPITAL_COMMUNITY): Payer: Self-pay | Admitting: Emergency Medicine

## 2020-03-13 DIAGNOSIS — R519 Headache, unspecified: Secondary | ICD-10-CM | POA: Insufficient documentation

## 2020-03-13 DIAGNOSIS — Y929 Unspecified place or not applicable: Secondary | ICD-10-CM | POA: Insufficient documentation

## 2020-03-13 DIAGNOSIS — Y9389 Activity, other specified: Secondary | ICD-10-CM | POA: Insufficient documentation

## 2020-03-13 DIAGNOSIS — S022XXA Fracture of nasal bones, initial encounter for closed fracture: Secondary | ICD-10-CM | POA: Insufficient documentation

## 2020-03-13 DIAGNOSIS — F17200 Nicotine dependence, unspecified, uncomplicated: Secondary | ICD-10-CM | POA: Insufficient documentation

## 2020-03-13 DIAGNOSIS — Y999 Unspecified external cause status: Secondary | ICD-10-CM | POA: Insufficient documentation

## 2020-03-13 MED ORDER — ACETAMINOPHEN 325 MG PO TABS
650.0000 mg | ORAL_TABLET | Freq: Once | ORAL | Status: AC
  Administered 2020-03-13: 650 mg via ORAL
  Filled 2020-03-13: qty 2

## 2020-03-13 MED ORDER — IBUPROFEN 600 MG PO TABS: 600.0000 mg | ORAL_TABLET | Freq: Four times a day (QID) | ORAL | 0 refills | Status: DC | PRN

## 2020-03-13 NOTE — Discharge Instructions (Signed)
Ice to help with the swelling, take the medications as needed for pain.  You can also take over the counter acetaminophen
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
## 2020-03-13 NOTE — ED Notes (Signed)
Patient verbalizes understanding of discharge instructions. Opportunity for questioning and answers were provided. Armband removed by staff, pt discharged from ED.  

## 2020-07-12 ENCOUNTER — Encounter (HOSPITAL_COMMUNITY): Payer: Self-pay

## 2020-07-12 ENCOUNTER — Other Ambulatory Visit: Payer: Self-pay

## 2020-07-12 ENCOUNTER — Ambulatory Visit (HOSPITAL_COMMUNITY)
Admission: EM | Admit: 2020-07-12 | Discharge: 2020-07-12 | Disposition: A | Discharge status: Home / Self Care | Payer: Medicaid Other | Attending: Family Medicine | Admitting: Family Medicine

## 2020-07-12 DIAGNOSIS — R369 Urethral discharge, unspecified: Secondary | ICD-10-CM | POA: Insufficient documentation

## 2020-07-12 DIAGNOSIS — Z711 Person with feared health complaint in whom no diagnosis is made: Secondary | ICD-10-CM

## 2020-07-12 MED ORDER — DOXYCYCLINE HYCLATE 100 MG PO CAPS: 100.0000 mg | ORAL_CAPSULE | Freq: Two times a day (BID) | ORAL | 0 refills | Status: DC

## 2020-07-12 MED ORDER — CEFTRIAXONE SODIUM 500 MG IJ SOLR
INTRAMUSCULAR | Status: AC
  Filled 2020-07-12: qty 500

## 2020-07-12 MED ORDER — LIDOCAINE HCL (PF) 1 % IJ SOLN
INTRAMUSCULAR | Status: AC
  Filled 2020-07-12: qty 2

## 2020-07-12 MED ORDER — CEFTRIAXONE SODIUM 500 MG IJ SOLR
500.0000 mg | Freq: Once | INTRAMUSCULAR | Status: AC
  Administered 2020-07-12: 500 mg via INTRAMUSCULAR

## 2020-07-12 NOTE — ED Triage Notes (Signed)
Pt is here wanting STD testing after noticing penile discharge and burning when urinating that started this morning, pt last had unprotected sex yesterday.

## 2020-07-12 NOTE — Discharge Instructions (Signed)

## 2020-07-12 NOTE — ED Provider Notes (Signed)
[facility] CARE CENTER   [id number] 07/12/20 Arrival Time: 0913  ASSESSMENT & PLAN:  1. Penile discharge   2. Concern about STD in male without diagnosis       Discharge Instructions     You have been given the following today for treatment of suspected gonorrhea and/or chlamydia:  cefTRIAXone (ROCEPHIN) injection 500 mg  Please pick up your prescription for doxycycline 100 mg and begin taking twice daily for the next seven (7) days.  Even though we have treated you today, we have sent testing for sexually transmitted infections. We will notify you of any positive results once they are received. If required, we will prescribe any medications you might need.  Please refrain from all sexual activity for at least the next seven days.     Pending: Labs Reviewed  CYTOLOGY, (ORAL, ANAL, URETHRAL) ANCILLARY ONLY    Will notify of any positive results. Instructed to refrain from sexual activity for at least seven days.  Reviewed expectations re: course of current medical issues. Questions answered. Outlined signs and symptoms indicating need for more acute intervention. Patient verbalized understanding. After Visit Summary given.   SUBJECTIVE:  Casey Lawson is a 34 y.o. male who presents with complaint of penile discharge. Onset abrupt. First noticed today. Describes discharge as thick and opaque. No specific aggravating or alleviating factors reported. Denies: urinary frequency and gross hematuria. Ques mild dysruia. Afebrile. No abdominal or pelvic pain. No n/v. No rashes or lesions. Reports that he is sexually active with single male partner    OBJECTIVE:  Vitals:   07/12/20 0955  BP: 130/83  Pulse: 63  Resp: 17  Temp: 98.2 F (36.8 C)  TempSrc: Oral  SpO2: 100%    General appearance: alert, cooperative, appears stated age and no distress Throat: lips, mucosa, and tongue normal; teeth and gums normal Lungs: unlabored respirations; speaks full sentences without  difficulty Back: no CVA tenderness; FROM at waist Abdomen: soft, non-tender GU: normal appearing genitalia; thick yellow penile discharge Skin: warm and dry Psychological: alert and cooperative; normal mood and affect   Labs Reviewed  CYTOLOGY, (ORAL, ANAL, URETHRAL) ANCILLARY ONLY    No Known Allergies  Past Medical History:  Diagnosis Date   Knee pain, chronic    Migraine    Family History  Problem Relation Age of Onset   Healthy Mother    Healthy Father    Social History   Socioeconomic History   Marital status: Single    Spouse name: Not on file   Number of children: Not on file   Years of education: Not on file   Highest education level: Not on file  Occupational History   Not on file  Tobacco Use   Smoking status: Light Tobacco Smoker    Types: Cigarettes, Cigars   Smokeless tobacco: Never Used   Tobacco comment: hookah  Vaping Use   Vaping Use: Never used  Substance and Sexual Activity   Alcohol use: Yes   Drug use: Yes    Frequency: 7.0 times per week    Types: Marijuana   Sexual activity: Yes    Birth control/protection: None  Other Topics Concern   Not on file  Social History Narrative   Not on file   Social Determinants of Health   Financial Resource Strain:    Difficulty of Paying Living Expenses: Not on file  Food Insecurity:    Worried About Radiation protection practitioner of Food in the Last Year: Not on file   [employer]  of Food in the Last Year: Not on file  Transportation Needs:    Lack of Transportation (Medical): Not on file   Lack of Transportation (Non-Medical): Not on file  Physical Activity:    Days of Exercise per Week: Not on file   Minutes of Exercise per Session: Not on file  Stress:    Feeling of Stress : Not on file  Social Connections:    Frequency of Communication with Friends and Family: Not on file   Frequency of Social Gatherings with Friends and Family: Not on file   Attends Religious Services: Not on  file   Active Member of Clubs or Organizations: Not on file   Attends Banker Meetings: Not on file   Marital Status: Not on file  Intimate Partner Violence:    Fear of Current or Ex-Partner: Not on file   Emotionally Abused: Not on file   Physically Abused: Not on file   Sexually Abused: Not on file          [place], MD 07/12/20 1032

## 2020-07-13 LAB — CYTOLOGY, (ORAL, ANAL, URETHRAL) ANCILLARY ONLY
Chlamydia: NEGATIVE
Comment: NEGATIVE
Comment: NEGATIVE
Comment: NORMAL
Neisseria Gonorrhea: POSITIVE — AB
Trichomonas: NEGATIVE

## 2022-03-18 ENCOUNTER — Emergency Department (HOSPITAL_COMMUNITY): Payer: Self-pay

## 2022-03-18 ENCOUNTER — Emergency Department (HOSPITAL_COMMUNITY)
Admission: EM | Admit: 2022-03-18 | Discharge: 2022-03-18 | Disposition: A | Discharge status: Home / Self Care | Payer: Self-pay | Attending: Emergency Medicine | Admitting: Emergency Medicine

## 2022-03-18 ENCOUNTER — Encounter (HOSPITAL_COMMUNITY): Payer: Self-pay | Admitting: Emergency Medicine

## 2022-03-18 ENCOUNTER — Other Ambulatory Visit: Payer: Self-pay

## 2022-03-18 DIAGNOSIS — S8252XA Displaced fracture of medial malleolus of left tibia, initial encounter for closed fracture: Secondary | ICD-10-CM | POA: Insufficient documentation

## 2022-03-18 DIAGNOSIS — S82402A Unspecified fracture of shaft of left fibula, initial encounter for closed fracture: Secondary | ICD-10-CM | POA: Insufficient documentation

## 2022-03-18 DIAGNOSIS — Z23 Encounter for immunization: Secondary | ICD-10-CM | POA: Insufficient documentation

## 2022-03-18 DIAGNOSIS — S90512A Abrasion, left ankle, initial encounter: Secondary | ICD-10-CM

## 2022-03-18 MED ORDER — TETANUS-DIPHTH-ACELL PERTUSSIS 5-2.5-18.5 LF-MCG/0.5 IM SUSY
0.5000 mL | PREFILLED_SYRINGE | Freq: Once | INTRAMUSCULAR | Status: AC
Start: 2022-03-18 — End: 2022-03-18
  Administered 2022-03-18: 0.5 mL via INTRAMUSCULAR
  Filled 2022-03-18: qty 0.5
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
## 2022-03-18 NOTE — Progress Notes (Signed)
Orthopedic Tech Progress Note Patient Details:  Casey Lawson 06-Dec-1985 [id number]  Ortho Devices Type of Ortho Device: CAM walker, Crutches Ortho Device/Splint Location: lle Ortho Device/Splint Interventions: Ordered, Application, Adjustment   Post Interventions Patient Tolerated: Well Instructions Provided: Care of device, Adjustment of device  Trinna Post 03/18/2022, 7:02 AM

## 2022-03-18 NOTE — Discharge Instructions (Addendum)
Keep your left foot elevated is much as possible.  Apply ice for 30 minutes at a time, 4 times a day.  Wear the cam walker at all times.  You may take it off to change the dressing on your abrasions, but need to put it on immediately after that.  No weightbearing on your left foot until cleared to do so by the orthopedic doctor.  You may take ibuprofen and/or acetaminophen as needed for pain.  Please be aware that if you combine ibuprofen and acetaminophen, you will get better pain relief than you get from taking either medication by itself.

## 2022-03-24 ENCOUNTER — Encounter (HOSPITAL_COMMUNITY): Payer: Self-pay

## 2022-03-24 ENCOUNTER — Ambulatory Visit (HOSPITAL_COMMUNITY)
Admission: EM | Admit: 2022-03-24 | Discharge: 2022-03-24 | Disposition: A | Discharge status: Home / Self Care | Payer: Medicaid Other | Attending: Internal Medicine | Admitting: Internal Medicine

## 2022-03-24 DIAGNOSIS — T148XXA Other injury of unspecified body region, initial encounter: Secondary | ICD-10-CM

## 2022-03-24 DIAGNOSIS — M25572 Pain in left ankle and joints of left foot: Secondary | ICD-10-CM

## 2022-03-24 DIAGNOSIS — L989 Disorder of the skin and subcutaneous tissue, unspecified: Secondary | ICD-10-CM

## 2022-03-24 DIAGNOSIS — S82892A Other fracture of left lower leg, initial encounter for closed fracture: Secondary | ICD-10-CM

## 2022-03-24 DIAGNOSIS — L089 Local infection of the skin and subcutaneous tissue, unspecified: Secondary | ICD-10-CM

## 2022-03-24 DIAGNOSIS — M25562 Pain in left knee: Secondary | ICD-10-CM

## 2022-03-24 HISTORY — DX: Other fracture of left lower leg, initial encounter for closed fracture: S82.892A

## 2022-03-24 HISTORY — DX: Disorder of the skin and subcutaneous tissue, unspecified: L98.9

## 2022-03-24 MED ORDER — HYDROCODONE-ACETAMINOPHEN 5-325 MG PO TABS: 1.0000 | ORAL_TABLET | Freq: Once | ORAL | Status: DC

## 2022-03-24 MED ORDER — HYDROCODONE-ACETAMINOPHEN 5-325 MG PO TABS: 2.0000 | ORAL_TABLET | ORAL | 0 refills | Status: DC | PRN

## 2022-03-24 MED ORDER — MUPIROCIN 2 % EX OINT: 1.0000 | TOPICAL_OINTMENT | Freq: Two times a day (BID) | CUTANEOUS | 0 refills | Status: DC

## 2022-03-24 MED ORDER — BACITRACIN ZINC 500 UNIT/GM EX OINT: TOPICAL_OINTMENT | Freq: Two times a day (BID) | CUTANEOUS | Status: DC

## 2022-03-24 MED ORDER — HYDROCODONE-ACETAMINOPHEN 5-325 MG PO TABS
ORAL_TABLET | ORAL | Status: AC
  Filled 2022-03-24: qty 1

## 2022-03-24 MED ORDER — CEPHALEXIN 500 MG PO CAPS: 500.0000 mg | ORAL_CAPSULE | Freq: Three times a day (TID) | ORAL | 0 refills | Status: AC

## 2022-03-24 NOTE — Discharge Instructions (Addendum)
The wound to your left foot appears infected.  Take Keflex 3 times a day (with breakfast lunch and dinner) for the next 7 days to treat the infection.   Take ibuprofen 600 mg every 6 hours and 650 mg of Tylenol every 6 hours as needed for pain with food.  You may use hydrocodone pain medicine for breakthrough pain if your pain is not relieved by Tylenol and ibuprofen.  Once your pain is better with the hydrocodone, return to using ibuprofen and Tylenol.  You may use hydrocodone every 6 hours but be aware of how much Tylenol you are taking because hydrocodone contains Tylenol in it as well.  You may only take 4000 mg of Tylenol per day.   Apply mupirocin ointment to the wound and keep it clean.  Apply the ointment twice daily and change the dressing twice daily with a nonstick gauze dressing and tape.  If you notice worsening signs of infection, worsening pain, or numbness and tingling to your left toes, please return to urgent care or go to the emergency department for severe symptoms.  Call the orthopedic doctor on your paperwork for follow-up.  Your work note is at the end of the packet.  Continue to stay off of your foot and use the crutches.  If you develop any new or worsening symptoms or do not improve in the next 2 to 3 days, please return.  If your symptoms are severe, please go to the emergency room.  Follow-up with your primary care provider for further evaluation and management of your symptoms as well as ongoing wellness visits.  I hope you feel better!
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
## 2022-03-24 NOTE — ED Triage Notes (Signed)
Pt states he has a broken left leg is in a cam boot. States his child hit his left leg while he had it elevated and he heard a pop and he is now having increasing pain.

## 2022-04-03 ENCOUNTER — Encounter (HOSPITAL_BASED_OUTPATIENT_CLINIC_OR_DEPARTMENT_OTHER): Payer: Self-pay | Admitting: Orthopedic Surgery

## 2022-04-03 ENCOUNTER — Other Ambulatory Visit: Payer: Self-pay
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
## 2022-04-03 NOTE — H&P (Signed)
PREOPERATIVE H&P  Chief Complaint: LEFT ANKLE FRACTURE  HPI: Casey Lawson is a 36 y.o. male who presents with a diagnosis of LEFT ANKLE FRACTURE. Symptoms are rated as moderate to severe, and have been worsening.  This is significantly impairing activities of daily living.  He has elected for surgical management.   Past Medical History:  Diagnosis Date   ADHD (attention deficit hyperactivity disorder)    Closed left ankle fracture 03/24/2022   Migraine    Skin abnormality 03/24/2022   left ankle scraped area healing with scab, using mupirocin ointment to   Uses crutches    Past Surgical History:  Procedure Laterality Date   arm surgery Left 2010   Social History   Socioeconomic History   Marital status: Single    Spouse name: Not on file   Number of children: Not on file   Years of education: Not on file   Highest education level: Not on file  Occupational History   Not on file  Tobacco Use   Smoking status: Never   Smokeless tobacco: Never   Tobacco comments:    hookah  Vaping Use   Vaping Use: Never used  Substance and Sexual Activity   Alcohol use: Yes    Comment: occ   Drug use: Yes    Frequency: 7.0 times per week    Types: Marijuana    Comment: daily for adhd   Sexual activity: Yes    Birth control/protection: None  Other Topics Concern   Not on file  Social History Narrative   Not on file   Social Determinants of Health   Financial Resource Strain: Not on file  Food Insecurity: Not on file  Transportation Needs: Not on file  Physical Activity: Not on file  Stress: Not on file  Social Connections: Not on file   Family History  Problem Relation Age of Onset   Healthy Mother    Healthy Father    No Known Allergies Prior to Admission medications   Medication Sig Start Date End Date Taking? Authorizing Provider  Aspirin-Acetaminophen-Caffeine (GOODY HEADACHE PO) Take by mouth as needed.   Yes [provider]  Cephalexin 500 MG tablet  Take 500 mg by mouth 3 (three) times daily.   Yes [provider]  HYDROcodone-acetaminophen (NORCO/VICODIN) 5-325 MG tablet Take 2 tablets by mouth every 4 (four) hours as needed. 03/24/22   Carlisle Beers, FNP  mupirocin ointment (BACTROBAN) 2 % Apply 1 Application topically 2 (two) times daily. 03/24/22   Carlisle Beers, FNP     Positive ROS: All other systems have been reviewed and were otherwise negative with the exception of those mentioned in the HPI and as above.  Physical Exam: General: Alert, no acute distress Cardiovascular: No pedal edema Respiratory: No cyanosis, no use of accessory musculature GI: No organomegaly, abdomen is soft and non-tender Skin: No lesions in the area of chief complaint Neurologic: Sensation intact distally Psychiatric: Patient is competent for consent with normal mood and affect Lymphatic: No axillary or cervical lymphadenopathy  MUSCULOSKELETAL: several abrasions to the left medial ankle and heel, TTP medial and lateral ankle, decreased ankle ROM, edema and erythema present, NVI   Imaging: acute fracture of the distal third of the fibular diaphysis with proximally 4 mm of anterior displacement of the distal fracture fragment and very mild comminution at the level of the fracture. There is also a minimally displaced (3 mm) fracture of the medial malleolus. A small osseous fragment is noted adjacent  to the distal tip of the tibia anteriorly   Assessment: LEFT ANKLE FRACTURE  Plan: Plan for Procedure(s): OPEN REDUCTION INTERNAL FIXATION (ORIF) ANKLE FRACTURE  The risks benefits and alternatives were discussed with the patient including but not limited to the risks of nonoperative treatment, versus surgical intervention including infection, bleeding, nerve injury,  blood clots, cardiopulmonary complications, morbidity, mortality, among others, and they were willing to proceed.   Weightbearing: NWB LLE Orthopedic devices:  splint Showering: keep splint dry Dressing: reinforce PRN, keep in place until follow up Medicines: ASA, Norco, Naproxen, Baclofen, Zofran  Discharge: home Follow up: 2 weeks post-op    Marzetta Board Office [phone] 04/03/2022 4:04 PM

## 2022-04-04 ENCOUNTER — Other Ambulatory Visit: Payer: Self-pay

## 2022-04-04 ENCOUNTER — Ambulatory Visit (HOSPITAL_BASED_OUTPATIENT_CLINIC_OR_DEPARTMENT_OTHER): Payer: Self-pay | Admitting: Anesthesiology

## 2022-04-04 ENCOUNTER — Encounter (HOSPITAL_BASED_OUTPATIENT_CLINIC_OR_DEPARTMENT_OTHER): Payer: Self-pay | Admitting: Orthopedic Surgery

## 2022-04-04 ENCOUNTER — Ambulatory Visit (HOSPITAL_BASED_OUTPATIENT_CLINIC_OR_DEPARTMENT_OTHER)
Admission: RE | Admit: 2022-04-04 | Discharge: 2022-04-04 | Disposition: A | Discharge status: Home / Self Care | Payer: Self-pay | Attending: Orthopedic Surgery | Admitting: Orthopedic Surgery

## 2022-04-04 ENCOUNTER — Encounter (HOSPITAL_BASED_OUTPATIENT_CLINIC_OR_DEPARTMENT_OTHER)
Admission: RE | Disposition: A | Discharge status: Home / Self Care | Payer: Self-pay | Source: Home / Self Care | Attending: Orthopedic Surgery

## 2022-04-04 DIAGNOSIS — S82892A Other fracture of left lower leg, initial encounter for closed fracture: Secondary | ICD-10-CM

## 2022-04-04 DIAGNOSIS — Z01818 Encounter for other preprocedural examination: Secondary | ICD-10-CM

## 2022-04-04 DIAGNOSIS — X58XXXA Exposure to other specified factors, initial encounter: Secondary | ICD-10-CM | POA: Insufficient documentation

## 2022-04-04 HISTORY — PX: ORIF ANKLE FRACTURE: SHX5408

## 2022-04-04 HISTORY — DX: Dependence on other enabling machines and devices: Z99.89

## 2022-04-04 HISTORY — DX: Attention-deficit hyperactivity disorder, unspecified type: F90.9

## 2022-04-04 SURGERY — OPEN REDUCTION INTERNAL FIXATION (ORIF) ANKLE FRACTURE
Anesthesia: Regional | Site: Ankle | Laterality: Left

## 2022-04-04 MED ORDER — GLYCOPYRROLATE PF 0.2 MG/ML IJ SOSY
PREFILLED_SYRINGE | INTRAMUSCULAR | Status: DC | PRN
  Administered 2022-04-04: .2 mg via INTRAVENOUS

## 2022-04-04 MED ORDER — FENTANYL CITRATE (PF) 100 MCG/2ML IJ SOLN
100.0000 ug | Freq: Once | INTRAMUSCULAR | Status: AC
  Administered 2022-04-04: 100 ug via INTRAVENOUS

## 2022-04-04 MED ORDER — FENTANYL CITRATE (PF) 100 MCG/2ML IJ SOLN
INTRAMUSCULAR | Status: AC
  Filled 2022-04-04: qty 2

## 2022-04-04 MED ORDER — NAPROXEN 500 MG PO TBEC: 500.0000 mg | DELAYED_RELEASE_TABLET | Freq: Two times a day (BID) | ORAL | 0 refills | Status: AC | PRN

## 2022-04-04 MED ORDER — CEFAZOLIN SODIUM-DEXTROSE 2-4 GM/100ML-% IV SOLN
INTRAVENOUS | Status: AC
  Filled 2022-04-04: qty 100

## 2022-04-04 MED ORDER — BACLOFEN 10 MG PO TABS: 10.0000 mg | ORAL_TABLET | Freq: Three times a day (TID) | ORAL | 0 refills | Status: DC | PRN

## 2022-04-04 MED ORDER — LACTATED RINGERS IV SOLN: INTRAVENOUS | Status: DC

## 2022-04-04 MED ORDER — CEPHALEXIN 500 MG PO CAPS: 500.0000 mg | ORAL_CAPSULE | Freq: Four times a day (QID) | ORAL | 0 refills | Status: AC

## 2022-04-04 MED ORDER — MIDAZOLAM HCL 2 MG/2ML IJ SOLN
INTRAMUSCULAR | Status: AC
  Filled 2022-04-04: qty 2

## 2022-04-04 MED ORDER — ACETAMINOPHEN 500 MG PO TABS
1000.0000 mg | ORAL_TABLET | Freq: Once | ORAL | Status: AC
  Administered 2022-04-04: 1000 mg via ORAL

## 2022-04-04 MED ORDER — BUPIVACAINE HCL (PF) 0.5 % IJ SOLN
INTRAMUSCULAR | Status: AC
  Filled 2022-04-04: qty 30

## 2022-04-04 MED ORDER — ONDANSETRON HCL 4 MG/2ML IJ SOLN
INTRAMUSCULAR | Status: AC
  Filled 2022-04-04: qty 2

## 2022-04-04 MED ORDER — DEXAMETHASONE SODIUM PHOSPHATE 10 MG/ML IJ SOLN
8.0000 mg | Freq: Once | INTRAMUSCULAR | Status: AC
  Administered 2022-04-04: 8 mg via INTRAVENOUS

## 2022-04-04 MED ORDER — HYDROCODONE-ACETAMINOPHEN 10-325 MG PO TABS: 1.0000 | ORAL_TABLET | Freq: Four times a day (QID) | ORAL | 0 refills | Status: DC | PRN

## 2022-04-04 MED ORDER — DEXMEDETOMIDINE HCL IN NACL 80 MCG/20ML IV SOLN
INTRAVENOUS | Status: AC
  Filled 2022-04-04: qty 20

## 2022-04-04 MED ORDER — ROPIVACAINE HCL 5 MG/ML IJ SOLN
INTRAMUSCULAR | Status: DC | PRN
  Administered 2022-04-04: 25 mL via PERINEURAL
  Administered 2022-04-04: 10 mL via PERINEURAL

## 2022-04-04 MED ORDER — DEXMEDETOMIDINE (PRECEDEX) IN NS 20 MCG/5ML (4 MCG/ML) IV SYRINGE
PREFILLED_SYRINGE | INTRAVENOUS | Status: DC | PRN
  Administered 2022-04-04 (×4): 4 ug via INTRAVENOUS

## 2022-04-04 MED ORDER — MIDAZOLAM HCL 2 MG/2ML IJ SOLN
2.0000 mg | Freq: Once | INTRAMUSCULAR | Status: AC
  Administered 2022-04-04: 2 mg via INTRAVENOUS

## 2022-04-04 MED ORDER — ASPIRIN 81 MG PO TBEC: 81.0000 mg | DELAYED_RELEASE_TABLET | Freq: Two times a day (BID) | ORAL | 0 refills | Status: DC

## 2022-04-04 MED ORDER — ONDANSETRON HCL 4 MG/2ML IJ SOLN
INTRAMUSCULAR | Status: DC | PRN
  Administered 2022-04-04: 4 mg via INTRAVENOUS

## 2022-04-04 MED ORDER — DEXAMETHASONE SODIUM PHOSPHATE 10 MG/ML IJ SOLN
INTRAMUSCULAR | Status: DC | PRN
  Administered 2022-04-04 (×2): 5 mg

## 2022-04-04 MED ORDER — LIDOCAINE 2% (20 MG/ML) 5 ML SYRINGE
INTRAMUSCULAR | Status: DC | PRN
  Administered 2022-04-04: 40 mg via INTRAVENOUS

## 2022-04-04 MED ORDER — PROPOFOL 10 MG/ML IV BOLUS
INTRAVENOUS | Status: DC | PRN
  Administered 2022-04-04: 20 mg via INTRAVENOUS
  Administered 2022-04-04: 300 mg via INTRAVENOUS

## 2022-04-04 MED ORDER — PROPOFOL 10 MG/ML IV BOLUS
INTRAVENOUS | Status: AC
  Filled 2022-04-04: qty 20

## 2022-04-04 MED ORDER — 0.9 % SODIUM CHLORIDE (POUR BTL) OPTIME
TOPICAL | Status: DC | PRN
  Administered 2022-04-04: 350 mL

## 2022-04-04 MED ORDER — CEFAZOLIN SODIUM-DEXTROSE 2-4 GM/100ML-% IV SOLN
2.0000 g | INTRAVENOUS | Status: AC
Start: 2022-04-04 — End: 2022-04-04
  Administered 2022-04-04: 2 g via INTRAVENOUS

## 2022-04-04 MED ORDER — ACETAMINOPHEN 500 MG PO TABS
ORAL_TABLET | ORAL | Status: AC
  Filled 2022-04-04: qty 2

## 2022-04-04 MED ORDER — POVIDONE-IODINE 10 % EX SWAB
2.0000 | Freq: Once | CUTANEOUS | Status: AC
  Administered 2022-04-04: 2 via TOPICAL

## 2022-04-04 MED ORDER — DEXAMETHASONE SODIUM PHOSPHATE 10 MG/ML IJ SOLN
INTRAMUSCULAR | Status: AC
  Filled 2022-04-04: qty 1

## 2022-04-04 MED ORDER — FENTANYL CITRATE (PF) 100 MCG/2ML IJ SOLN: 25.0000 ug | INTRAMUSCULAR | Status: DC | PRN

## 2022-04-04 MED ORDER — ONDANSETRON 4 MG PO TBDP: 4.0000 mg | ORAL_TABLET | Freq: Two times a day (BID) | ORAL | 0 refills | Status: DC | PRN

## 2022-04-04 SURGICAL SUPPLY — 86 items
APL PRP STRL LF DISP 70% ISPRP (MISCELLANEOUS) ×1
BANDAGE ESMARK 6X9 LF (GAUZE/BANDAGES/DRESSINGS) ×2 IMPLANT
BIT DRILL 2.6 (BIT) ×2
BIT DRILL 2.6X VARIAX 2 (BIT) IMPLANT
BIT DRILL CANN 2.7 (BIT) ×2
BIT DRILL SRG 2.7XCANN AO CPLG (BIT) IMPLANT
BIT DRL 2.6X VARIAX 2 (BIT) ×1
BIT DRL SRG 2.7XCANN AO CPLNG (BIT) ×1
BLADE SURG 15 STRL LF DISP TIS (BLADE) ×4 IMPLANT
BLADE SURG 15 STRL SS (BLADE) ×4
BNDG CMPR 9X6 STRL LF SNTH (GAUZE/BANDAGES/DRESSINGS) ×1
BNDG COHESIVE 4X5 TAN STRL (GAUZE/BANDAGES/DRESSINGS) ×3 IMPLANT
BNDG ELASTIC 4X5.8 VLCR STR LF (GAUZE/BANDAGES/DRESSINGS) ×3 IMPLANT
BNDG ELASTIC 6X5.8 VLCR STR LF (GAUZE/BANDAGES/DRESSINGS) ×3 IMPLANT
BNDG ESMARK 6X9 LF (GAUZE/BANDAGES/DRESSINGS) ×2
BNDG GAUZE DERMACEA FLUFF (GAUZE/BANDAGES/DRESSINGS) ×1
BNDG GAUZE DERMACEA FLUFF 4 (GAUZE/BANDAGES/DRESSINGS) ×2 IMPLANT
BNDG GZE DERMACEA 4 6PLY (GAUZE/BANDAGES/DRESSINGS) ×1
CHLORAPREP W/TINT 26 (MISCELLANEOUS) ×3 IMPLANT
COVER BACK TABLE 60X90IN (DRAPES) ×3 IMPLANT
COVER MAYO STAND STRL (DRAPES) ×3 IMPLANT
CUFF TOURN SGL QUICK 18 (TOURNIQUET CUFF) ×1 IMPLANT
CUFF TOURN SGL QUICK 24 (TOURNIQUET CUFF)
CUFF TOURN SGL QUICK 34 (TOURNIQUET CUFF)
CUFF TRNQT CYL 24X4X16.5-23 (TOURNIQUET CUFF) IMPLANT
CUFF TRNQT CYL 34X4.125X (TOURNIQUET CUFF) IMPLANT
DECANTER SPIKE VIAL GLASS SM (MISCELLANEOUS) IMPLANT
DRAPE C-ARM 35X43 STRL (DRAPES) ×3 IMPLANT
DRAPE EXTREMITY T 121X128X90 (DISPOSABLE) ×3 IMPLANT
DRAPE IMP U-DRAPE 54X76 (DRAPES) ×3 IMPLANT
DRAPE OEC MINIVIEW 54X84 (DRAPES) ×1 IMPLANT
DRAPE U-SHAPE 47X51 STRL (DRAPES) ×3 IMPLANT
DRSG EMULSION OIL 3X3 NADH (GAUZE/BANDAGES/DRESSINGS) ×5 IMPLANT
DRSG PAD ABDOMINAL 8X10 ST (GAUZE/BANDAGES/DRESSINGS) ×3 IMPLANT
ELECT REM PT RETURN 9FT ADLT (ELECTROSURGICAL) ×2
ELECTRODE REM PT RTRN 9FT ADLT (ELECTROSURGICAL) ×2 IMPLANT
GAUZE 4X4 16PLY ~~LOC~~+RFID DBL (SPONGE) ×3 IMPLANT
GAUZE PAD ABD 8X10 STRL (GAUZE/BANDAGES/DRESSINGS) ×1 IMPLANT
GAUZE SPONGE 4X4 12PLY STRL (GAUZE/BANDAGES/DRESSINGS) ×3 IMPLANT
GLOVE BIO SURGEON STRL SZ7.5 (GLOVE) ×5 IMPLANT
GLOVE BIOGEL PI IND STRL 7.5 (GLOVE) ×2 IMPLANT
GLOVE BIOGEL PI IND STRL 8 (GLOVE) ×4 IMPLANT
GLOVE BIOGEL PI INDICATOR 7.5 (GLOVE) ×1
GLOVE BIOGEL PI INDICATOR 8 (GLOVE) ×1
GLOVE SURG SS PI 7.5 STRL IVOR (GLOVE) ×3 IMPLANT
GOWN STRL REUS W/TWL LRG LVL3 (GOWN DISPOSABLE) ×3 IMPLANT
GOWN STRL REUS W/TWL XL LVL3 (GOWN DISPOSABLE) ×3 IMPLANT
IMPL TIGHTROP W/DRV K-LESS (Anchor) IMPLANT
IMPLANT TIGHTROPE W/DRV K-LESS (Anchor) ×2 IMPLANT
K-WIRE ORTHOPEDIC 1.4X150L (WIRE) ×4
KIT TURNOVER CYSTO (KITS) ×3 IMPLANT
KWIRE ORTHOPEDIC 1.4X150L (WIRE) IMPLANT
NEEDLE HYPO 22GX1.5 SAFETY (NEEDLE) ×1 IMPLANT
NS IRRIG 1000ML POUR BTL (IV SOLUTION) ×2 IMPLANT
PACK BASIN DAY SURGERY FS (CUSTOM PROCEDURE TRAY) ×3 IMPLANT
PAD CAST 4YDX4 CTTN HI CHSV (CAST SUPPLIES) ×2 IMPLANT
PADDING CAST ABS 4INX4YD NS (CAST SUPPLIES) ×2
PADDING CAST ABS COTTON 4X4 ST (CAST SUPPLIES) ×4 IMPLANT
PADDING CAST COTTON 4X4 STRL (CAST SUPPLIES) ×2
PADDING CAST COTTON 6X4 STRL (CAST SUPPLIES) ×3 IMPLANT
PENCIL SMOKE EVACUATOR (MISCELLANEOUS) ×3 IMPLANT
PLATE 1/3 TUBULAR 7H (Plate) ×1 IMPLANT
SCREW BONE CANNULATED 4X44MM (Screw) ×1 IMPLANT
SCREW CORTEX ST MATTA 3.5X12MM (Screw) ×4 IMPLANT
SCREW CORTEX ST MATTA 3.5X14 (Screw) ×2 IMPLANT
SPLINT FAST PLASTER 5X30 (CAST SUPPLIES) ×20
SPLINT PLASTER CAST FAST 5X30 (CAST SUPPLIES) ×40 IMPLANT
SPONGE T-LAP 4X18 ~~LOC~~+RFID (SPONGE) ×3 IMPLANT
STOCKINETTE 6  STRL (DRAPES) ×2
STOCKINETTE 6 STRL (DRAPES) IMPLANT
STRIP CLOSURE SKIN 1/2X4 (GAUZE/BANDAGES/DRESSINGS) ×3 IMPLANT
SUCTION FRAZIER HANDLE 10FR (MISCELLANEOUS) ×2
SUCTION TUBE FRAZIER 10FR DISP (MISCELLANEOUS) ×2 IMPLANT
SUT ETHILON 3 0 PS 1 (SUTURE) IMPLANT
SUT MNCRL AB 4-0 PS2 18 (SUTURE) IMPLANT
SUT MON AB 2-0 CT1 36 (SUTURE) IMPLANT
SUT MON AB 3-0 SH 27 (SUTURE) ×2
SUT MON AB 3-0 SH27 (SUTURE) IMPLANT
SUT VIC AB 0 CT1 36 (SUTURE) ×3 IMPLANT
SUT VIC AB 2-0 SH 27 (SUTURE)
SUT VIC AB 2-0 SH 27XBRD (SUTURE) IMPLANT
SYR BULB EAR ULCER 3OZ GRN STR (SYRINGE) ×3 IMPLANT
SYR CONTROL 10ML LL (SYRINGE) ×1 IMPLANT
TOWEL OR 17X26 10 PK STRL BLUE (TOWEL DISPOSABLE) ×3 IMPLANT
TUBE CONNECTING 12X1/4 (SUCTIONS) ×3 IMPLANT
UNDERPAD 30X36 HEAVY ABSORB (UNDERPADS AND DIAPERS) ×3 IMPLANT

## 2022-04-04 NOTE — Anesthesia Procedure Notes (Signed)
Anesthesia Regional Block: Popliteal block   Pre-Anesthetic Checklist: , timeout performed,  Correct Patient, Correct Site, Correct Laterality,  Correct Procedure, Correct Position, site marked,  Risks and benefits discussed,  Pre-op evaluation,  At surgeon's request and post-op pain management  Laterality: Left  Prep: Maximum Sterile Barrier Precautions used, chloraprep       Needles:  Injection technique: Single-shot  Needle Type: Echogenic Stimulator Needle     Needle Length: 9cm  Needle Gauge: 21     Additional Needles:   Procedures:,,,, ultrasound used (permanent image in chart),,    Narrative:  Start time: 04/04/2022 10:45 AM End time: 04/04/2022 10:48 AM Injection made incrementally with aspirations every 5 mL. Anesthesiologist: Elmer Picker, MD

## 2022-04-04 NOTE — Anesthesia Procedure Notes (Signed)
Anesthesia Regional Block: Adductor canal block   Pre-Anesthetic Checklist: , timeout performed,  Correct Patient, Correct Site, Correct Laterality,  Correct Procedure, Correct Position, site marked,  Risks and benefits discussed,  Pre-op evaluation,  At surgeon's request and post-op pain management  Laterality: Left  Prep: Maximum Sterile Barrier Precautions used, chloraprep       Needles:  Injection technique: Single-shot  Needle Type: Echogenic Stimulator Needle     Needle Length: 9cm  Needle Gauge: 21     Additional Needles:   Procedures:,,,, ultrasound used (permanent image in chart),,    Narrative:  Start time: 04/04/2022 10:50 AM End time: 04/04/2022 10:54 AM Injection made incrementally with aspirations every 5 mL. Anesthesiologist: Elmer Picker, MD

## 2022-04-04 NOTE — Anesthesia Procedure Notes (Signed)
Procedure Name: LMA Insertion Date/Time: 04/04/2022 11:07 AM  Performed by: Norva Pavlov, CRNAPre-anesthesia Checklist: Patient identified, Emergency Drugs available, Suction available and Patient being monitored Patient Re-evaluated:Patient Re-evaluated prior to induction Oxygen Delivery Method: Circle system utilized Preoxygenation: Pre-oxygenation with 100% oxygen Induction Type: IV induction Ventilation: Mask ventilation without difficulty LMA: LMA inserted LMA Size: 4.0 Number of attempts: 1 Airway Equipment and Method: Bite block Placement Confirmation: positive ETCO2 Tube secured with: Tape Dental Injury: Teeth and Oropharynx as per pre-operative assessment

## 2022-04-04 NOTE — Transfer of Care (Addendum)
Immediate Anesthesia Transfer of Care Note  Patient: Casey Lawson  Procedure(s) Performed: Procedure(s) (LRB): OPEN REDUCTION INTERNAL FIXATION (ORIF) ANKLE FRACTURE (Left)  Patient Location: PACU  Anesthesia Type: General  Level of Consciousness: drowsy  Airway & Oxygen Therapy: Patient Spontanous Breathing and Patient connected to nasal cannula oxygen  Post-op Assessment: Report given to PACU RN and Post -op Vital signs reviewed and stable  Post vital signs: Reviewed and stable  Complications: No apparent anesthesia complications  Last Vitals:  Vitals Value Taken Time  BP 103/60 04/04/22 1257  Temp    Pulse 47 04/04/22 1300  Resp 14 04/04/22 1300  SpO2 100 % 04/04/22 1300  Vitals shown include unvalidated device data.  Last Pain:  Vitals:   04/04/22 1002  TempSrc: Oral  PainSc: 2       Patients Stated Pain Goal: 4 (04/04/22 1002)  Complications: No notable events documented.

## 2022-04-04 NOTE — Op Note (Signed)
04/04/2022  12:12 PM  PATIENT:  Casey Lawson    PRE-OPERATIVE DIAGNOSIS:  LEFT ANKLE FRACTURE  POST-OPERATIVE DIAGNOSIS:  Same  PROCEDURE:  OPEN REDUCTION INTERNAL FIXATION (ORIF) ANKLE FRACTURE  SURGEON:  Sheral Apley, MD  ASSISTANT: Levester Fresh, PA-C, he was present and scrubbed throughout the case, critical for completion in a timely fashion, and for retraction, instrumentation, and closure.   ANESTHESIA:   gen   PREOPERATIVE INDICATIONS:  Casey Lawson is a  36 y.o. male with a diagnosis of LEFT ANKLE FRACTURE who failed conservative measures and elected for surgical management.    The risks benefits and alternatives were discussed with the patient preoperatively including but not limited to the risks of infection, bleeding, nerve injury, cardiopulmonary complications, the need for revision surgery, among others, and the patient was willing to proceed.  OPERATIVE IMPLANTS: stryker plate/screw, tight rope  OPERATIVE FINDINGS: Unstable ankle fracture. Stable syndesmosis post op  BLOOD LOSS: min  COMPLICATIONS: none  TOURNIQUET TIME: 60  OPERATIVE PROCEDURE:  Patient was identified in the preoperative holding area and site was marked by me He was transported to the operating theater and placed on the table in supine position taking care to pad all bony prominences. After a preincinduction time out anesthesia was induced. The left lower extremity was prepped and draped in normal sterile fashion and a pre-incision timeout was performed. Casey Lawson received ancef for preoperative antibiotics.   I made a lateral incision of roughly 7 cm dissection was carried down sharply to the distal fibula and then spreading dissection was used proximally to protect the superficial peroneal nerve. I sharply incised the periosteum and took care to protect the peroneal tendons. I then debrided the fracture site and performed a reduction maneuver which was held in place with a clamp.   I reduced  and clamped the fracture  I then selected a 7-hole one third tubular plate and placed in a neutralization fashion care was taken distally so as not to penetrate the joint with the cancellus screws.  I then turned my attention medially where I created a 4 cm incision and dissected sharply down to the medial Mal fracture taking care to protect the saphenous vein. I debrided the fracture and reduced and held in place with a tenaculum. I then drilled and placed 1 partially threaded 45 mm cannulated screw across the fracture.  I then stressed the syndesmosis and it was unstable for syndesmotic fixation I performed a reduction maneuver with a clamp and placed a tight rope  The wound was then thoroughly irrigated and closed using a 0 Vicryl and absorbable Monocryl sutures. He was placed in a short leg splint.   POST OPERATIVE PLAN: Non-weightbearing. DVT prophylaxis will consist of mobilization and chemical px

## 2022-04-04 NOTE — Interval H&P Note (Signed)
History and Physical Interval Note:  04/04/2022 9:47 AM  Casey Lawson  has presented today for surgery, with the diagnosis of LEFT ANKLE FRACTURE.  The various methods of treatment have been discussed with the patient and family. After consideration of risks, benefits and other options for treatment, the patient has consented to  Procedure(s): OPEN REDUCTION INTERNAL FIXATION (ORIF) ANKLE FRACTURE (Left) as a surgical intervention.  The patient's history has been reviewed, patient examined, no change in status, stable for surgery.  I have reviewed the patient's chart and labs.  Questions were answered to the patient's satisfaction.     Sheral Apley

## 2022-04-04 NOTE — Discharge Instructions (Addendum)
POST-OPERATIVE OPIOID TAPER INSTRUCTIONS: It is important to wean off of your opioid medication as soon as possible. If you do not need pain medication after your surgery it is ok to stop day one. Opioids include: Codeine, Hydrocodone(Norco, Vicodin), Oxycodone(Percocet, oxycontin) and hydromorphone amongst others.  Long term and even short term use of opiods can cause: Increased pain response Dependence Constipation Depression Respiratory depression And more.  Withdrawal symptoms can include Flu like symptoms Nausea, vomiting And more Techniques to manage these symptoms Hydrate well Eat regular healthy meals Stay active Use relaxation techniques(deep breathing, meditating, yoga) Do Not substitute Alcohol to help with tapering If you have been on opioids for less than two weeks and do not have pain than it is ok to stop all together.  Plan to wean off of opioids This plan should start within one week post op of your joint replacement. Maintain the same interval or time between taking each dose and first decrease the dose.  Cut the total daily intake of opioids by one tablet each day Next start to increase the time between doses. The last dose that should be eliminated is the evening dose.     Nonweight bearing on operative leg     Regional Anesthesia Blocks  1. Numbness or the inability to move the "blocked" extremity may last from 3-48 hours after placement. The length of time depends on the medication injected and your individual response to the medication. If the numbness is not going away after 48 hours, call your surgeon.  2. The extremity that is blocked will need to be protected until the numbness is gone and the  Strength has returned. Because you cannot feel it, you will need to take extra care to avoid injury. Because it may be weak, you may have difficulty moving it or using it. You may not know what position it is in without looking at it while the block is in  effect.  3. For blocks in the legs and feet, returning to weight bearing and walking needs to be done carefully. You will need to wait until the numbness is entirely gone and the strength has returned. You should be able to move your leg and foot normally before you try and bear weight or walk. You will need someone to be with you when you first try to ensure you do not fall and possibly risk injury.  4. Bruising and tenderness at the needle site are common side effects and will resolve in a few days.  5. Persistent numbness or new problems with movement should be communicated to the surgeon.    No acetaminophen/Tylenol until after 3:45pm today if needed for pain.      Post Anesthesia Home Care Instructions  Activity: Get plenty of rest for the remainder of the day. A responsible individual must stay with you for 24 hours following the procedure.  For the next 24 hours, DO NOT: -Drive a car -Advertising copywriter -Drink alcoholic beverages -Take any medication unless instructed by your physician -Make any legal decisions or sign important papers.  Meals: Start with liquid foods such as gelatin or soup. Progress to regular foods as tolerated. Avoid greasy, spicy, heavy foods. If nausea and/or vomiting occur, drink only clear liquids until the nausea and/or vomiting subsides. Call your physician if vomiting continues.  Special Instructions/Symptoms: Your throat may feel dry or sore from the anesthesia or the breathing tube placed in your throat during surgery. If this causes discomfort, gargle with warm salt water.  The discomfort should disappear within 24 hours.

## 2022-04-04 NOTE — Progress Notes (Signed)
Assisted Dr. Armond Hang with left, popliteal block. Side rails up, monitors on throughout procedure. See vital signs in flow sheet. Tolerated Procedure well.

## 2022-04-04 NOTE — Anesthesia Postprocedure Evaluation (Signed)
Anesthesia Post Note  Patient: Casey Lawson  Procedure(s) Performed: OPEN REDUCTION INTERNAL FIXATION (ORIF) ANKLE FRACTURE (Left: Ankle)     Patient location during evaluation: PACU Anesthesia Type: Regional and General Level of consciousness: awake and alert Pain management: pain level controlled Vital Signs Assessment: post-procedure vital signs reviewed and stable Respiratory status: spontaneous breathing, nonlabored ventilation, respiratory function stable and patient connected to nasal cannula oxygen Cardiovascular status: blood pressure returned to baseline and stable Postop Assessment: no apparent nausea or vomiting Anesthetic complications: no   No notable events documented.  Last Vitals:  Vitals:   04/04/22 1315 04/04/22 1330  BP: 110/67 101/63  Pulse: (!) 44 (!) 43  Resp: 14 13  Temp:  (!) 36.3 C  SpO2: 100% 99%    Last Pain:  Vitals:   04/04/22 1330  TempSrc:   PainSc: 0-No pain                 Eshan Trupiano L Karmon Andis

## 2022-04-04 NOTE — Anesthesia Preprocedure Evaluation (Addendum)
Anesthesia Evaluation  Patient identified by MRN, date of birth, ID band Patient awake    Reviewed: Allergy & Precautions, NPO status , Patient's Chart, lab work & pertinent test results  Airway Mallampati: II  TM Distance: >3 FB Neck ROM: Full    Dental  (+) Poor Dentition, Dental Advisory Given, Chipped, Missing,    Pulmonary neg pulmonary ROS,    Pulmonary exam normal breath sounds clear to auscultation       Cardiovascular negative cardio ROS Normal cardiovascular exam Rhythm:Regular Rate:Normal     Neuro/Psych  Headaches, negative psych ROS   GI/Hepatic negative GI ROS, (+)     substance abuse  marijuana use,   Endo/Other  negative endocrine ROS  Renal/GU negative Renal ROS  negative genitourinary   Musculoskeletal negative musculoskeletal ROS (+)   Abdominal   Peds  (+) ADHD Hematology negative hematology ROS (+)   Anesthesia Other Findings   Reproductive/Obstetrics                            Anesthesia Physical Anesthesia Plan  ASA: 2  Anesthesia Plan: General and Regional   Post-op Pain Management: Regional block* and Tylenol PO (pre-op)*   Induction: Intravenous  PONV Risk Score and Plan: 2 and Ondansetron, Dexamethasone and Midazolam  Airway Management Planned: LMA  Additional Equipment:   Intra-op Plan:   Post-operative Plan: Extubation in OR  Informed Consent: I have reviewed the patients History and Physical, chart, labs and discussed the procedure including the risks, benefits and alternatives for the proposed anesthesia with the patient or authorized representative who has indicated his/her understanding and acceptance.     Dental advisory given  Plan Discussed with: CRNA  Anesthesia Plan Comments:         Anesthesia Quick Evaluation

## 2022-04-05 ENCOUNTER — Encounter (HOSPITAL_BASED_OUTPATIENT_CLINIC_OR_DEPARTMENT_OTHER): Payer: Self-pay | Admitting: Orthopedic Surgery

## 2022-04-17 ENCOUNTER — Encounter (HOSPITAL_BASED_OUTPATIENT_CLINIC_OR_DEPARTMENT_OTHER): Payer: Self-pay | Admitting: Orthopedic Surgery

## 2022-04-17 NOTE — Addendum Note (Signed)
Addendum  created 04/17/22 1534 by Elmer Picker, MD   Intraprocedure Event edited, Intraprocedure Staff edited

## 2022-10-27 ENCOUNTER — Ambulatory Visit (HOSPITAL_COMMUNITY)
Admission: EM | Admit: 2022-10-27 | Discharge: 2022-10-27 | Disposition: A | Discharge status: Home / Self Care | Payer: Medicaid Other | Attending: Physician Assistant | Admitting: Physician Assistant

## 2022-10-27 ENCOUNTER — Encounter (HOSPITAL_COMMUNITY): Payer: Self-pay | Admitting: Physician Assistant

## 2022-10-27 DIAGNOSIS — Z113 Encounter for screening for infections with a predominantly sexual mode of transmission: Secondary | ICD-10-CM | POA: Diagnosis not present

## 2022-10-27 LAB — HEPATITIS C ANTIBODY: HCV Ab: NONREACTIVE

## 2022-10-27 LAB — HIV ANTIBODY (ROUTINE TESTING W REFLEX): HIV Screen 4th Generation wRfx: NONREACTIVE

## 2022-10-27 NOTE — Discharge Instructions (Signed)
Monitor your MyChart for results.  We will contact you if we need to arrange any treatment.  I do recommend that you abstain from sex until you receive your results.  You should use a condom with each sexual encounter.  If you develop any symptoms including penile lesions, discharge from your penis, burning when you pee you should be seen immediately.

## 2022-10-27 NOTE — ED Triage Notes (Signed)
Pt is here for STD- testing. Denies any symptoms.

## 2022-10-27 NOTE — ED Provider Notes (Signed)
[facility]    CSN: [id number] Arrival date & time: 10/27/22  1644      History   Chief Complaint Chief Complaint  Patient presents with   Exposure to STD    HPI Casey Lawson is a 37 y.o. male.   Patient presents today requesting STI testing.  Reports that he recently ended relationship and is requesting a full panel.  Denies any symptoms including penile discharge, genital lesion, dysuria, abdominal pain, pelvic pain, fever, nausea, vomiting.  He is sexually active with male partners and does not always use condoms.  Denies any recent antibiotic use.  Reports remote history of STI in the past but completed treatment and had resolution of symptoms.  No additional complaints or concerns today.    Past Medical History:  Diagnosis Date   ADHD (attention deficit hyperactivity disorder)    Closed left ankle fracture 03/24/2022   Migraine    Skin abnormality 03/24/2022   left ankle scraped area healing with scab, using mupirocin ointment to   Uses crutches     Patient Active Problem List   Diagnosis Date Noted   Ankle fracture, left 04/04/2022   TOOTH BROKEN FRACTURE DUE TO TRAUMA COMPLICATED [id number]    Past Surgical History:  Procedure Laterality Date   arm surgery Left 2010   ORIF ANKLE FRACTURE Left 04/04/2022   Procedure: OPEN REDUCTION INTERNAL FIXATION (ORIF) ANKLE FRACTURE;  Surgeon: Renette Butters, MD;  Location: [facility];  Service: Orthopedics;  Laterality: Left;       Home Medications    Prior to Admission medications   Medication Sig Start Date End Date Taking? Authorizing Provider  aspirin EC 81 MG tablet Take 1 tablet (81 mg total) by mouth 2 (two) times daily. To prevent blood clots for 30 days after surgery. 04/04/22   Britt Bottom, PA-C  baclofen (LIORESAL) 10 MG tablet Take 1 tablet (10 mg total) by mouth 3 (three) times daily as needed for muscle spasms. 04/04/22 04/04/23  Britt Bottom, PA-C  Cephalexin 500 MG  tablet Take 500 mg by mouth 3 (three) times daily.    [provider]  HYDROcodone-acetaminophen (NORCO) 10-325 MG tablet Take 1-2 tablets by mouth every 6 (six) hours as needed for severe pain. 04/04/22   Britt Bottom, PA-C  ondansetron (ZOFRAN-ODT) 4 MG disintegrating tablet Take 1 tablet (4 mg total) by mouth 2 (two) times daily as needed for nausea or vomiting. 04/04/22   Britt Bottom, PA-C    Family History Family History  Problem Relation Age of Onset   Healthy Mother    Healthy Father     Social History Social History   Tobacco Use   Smoking status: Never   Smokeless tobacco: Never   Tobacco comments:    hookah  Vaping Use   Vaping Use: Never used  Substance Use Topics   Alcohol use: Yes    Comment: occ   Drug use: Yes    Frequency: 7.0 times per week    Types: Marijuana    Comment: daily for adhd     Allergies   Patient has no known allergies.   Review of Systems Review of Systems  Constitutional:  Negative for activity change, appetite change, fatigue and fever.  Gastrointestinal:  Negative for abdominal pain, diarrhea, nausea and vomiting.  Genitourinary:  Negative for dysuria, frequency, genital sores, penile discharge, penile pain and urgency.     Physical Exam Triage Vital Signs ED Triage Vitals [10/27/22  1807]  Enc Vitals Group     BP (!) 157/99     Pulse Rate 82     Resp 18     Temp 98.5 F (36.9 C)     Temp Source Oral     SpO2 100 %     Weight      Height      Head Circumference      Peak Flow      Pain Score      Pain Loc      Pain Edu?      Excl. in [place]?    No data found.  Updated Vital Signs BP (!) 157/99 (BP Location: Left Arm)   Pulse 82   Temp 98.5 F (36.9 C) (Oral)   Resp 18   SpO2 100%   Visual Acuity Right Eye Distance:   Left Eye Distance:   Bilateral Distance:    Right Eye Near:   Left Eye Near:    Bilateral Near:     Physical Exam Vitals reviewed.  Constitutional:      General: He is awake.      Appearance: Normal appearance. He is well-developed. He is not ill-appearing.     Comments: Very pleasant male appears stated age in no acute distress sitting comfortably in exam room  HENT:     Head: Normocephalic and atraumatic.     Mouth/Throat:     Pharynx: Uvula midline. No oropharyngeal exudate or posterior oropharyngeal erythema.  Cardiovascular:     Rate and Rhythm: Normal rate and regular rhythm.     Heart sounds: Normal heart sounds, S1 normal and S2 normal. No murmur heard. Pulmonary:     Effort: Pulmonary effort is normal.     Breath sounds: Normal breath sounds. No stridor. No wheezing, rhonchi or rales.     Comments: Clear to auscultation bilaterally Abdominal:     General: Bowel sounds are normal.     Palpations: Abdomen is soft.     Tenderness: There is no abdominal tenderness. There is no right CVA tenderness, left CVA tenderness, guarding or rebound.  Neurological:     Mental Status: He is alert.  Psychiatric:        Behavior: Behavior is cooperative.      UC Treatments / Results  Labs (all labs ordered are listed, but only abnormal results are displayed) Labs Reviewed  RPR  HIV ANTIBODY (ROUTINE TESTING W REFLEX)  HEPATITIS C ANTIBODY  CYTOLOGY, (ORAL, ANAL, URETHRAL) ANCILLARY ONLY    EKG   Radiology No results found.  Procedures Procedures (including critical care time)  Medications Ordered in UC Medications - No data to display  Initial Impression / Assessment and Plan / UC Course  I have reviewed the triage vital signs and the nursing notes.  Pertinent labs & imaging results that were available during my care of the patient were reviewed by me and considered in my medical decision making (see chart for details).     STI panel including HIV, syphilis, hepatitis C testing was obtained today and is pending.  He was instructed to abstain from sex until results are available.  Discussed the importance of safe sex practices.  If he develops  any symptoms he is to return for reevaluation.  All questions answered to patient satisfaction.  Final Clinical Impressions(s) / UC Diagnoses   Final diagnoses:  Routine screening for STI (sexually transmitted infection)     Discharge Instructions      Monitor your MyChart for  results.  We will contact you if we need to arrange any treatment.  I do recommend that you abstain from sex until you receive your results.  You should use a condom with each sexual encounter.  If you develop any symptoms including penile lesions, discharge from your penis, burning when you pee you should be seen immediately.     ED Prescriptions   None    PDMP not reviewed this encounter.   Terrilee Croak, PA-C 10/27/22 1844

## 2022-10-28 LAB — RPR: RPR Ser Ql: NONREACTIVE

## 2022-10-30 ENCOUNTER — Telehealth (HOSPITAL_COMMUNITY): Payer: Self-pay | Admitting: Emergency Medicine

## 2022-10-30 LAB — CYTOLOGY, (ORAL, ANAL, URETHRAL) ANCILLARY ONLY
Chlamydia: POSITIVE — AB
Comment: NEGATIVE
Comment: NEGATIVE
Comment: NORMAL
Neisseria Gonorrhea: NEGATIVE
Trichomonas: POSITIVE — AB

## 2022-10-30 MED ORDER — METRONIDAZOLE 500 MG PO TABS: 2000.0000 mg | ORAL_TABLET | Freq: Once | ORAL | 0 refills | Status: DC

## 2022-10-30 MED ORDER — DOXYCYCLINE HYCLATE 100 MG PO CAPS: 100.0000 mg | ORAL_CAPSULE | Freq: Two times a day (BID) | ORAL | 0 refills | Status: DC

## 2022-11-01 MED ORDER — METRONIDAZOLE 500 MG PO TABS: 2000.0000 mg | ORAL_TABLET | Freq: Once | ORAL | 0 refills | Status: AC

## 2022-11-01 MED ORDER — DOXYCYCLINE HYCLATE 100 MG PO CAPS: 100.0000 mg | ORAL_CAPSULE | Freq: Two times a day (BID) | ORAL | 0 refills | Status: AC

## 2022-11-01 NOTE — Addendum Note (Signed)
Addended by: Thressa Sheller on: 11/01/2022 06:47 PM   Modules accepted: Orders

## 2022-11-01 NOTE — Telephone Encounter (Signed)
Patient calling in wanting medications re-sent to the [facility] on [address]. Medication sent in to preferred pharmacy per protocol.

## 2022-11-17 ENCOUNTER — Encounter (HOSPITAL_COMMUNITY): Payer: Self-pay

## 2022-11-17 ENCOUNTER — Ambulatory Visit (HOSPITAL_COMMUNITY)
Admission: EM | Admit: 2022-11-17 | Discharge: 2022-11-17 | Disposition: A | Discharge status: Home / Self Care | Payer: Medicaid Other | Attending: Family Medicine | Admitting: Family Medicine

## 2022-11-17 DIAGNOSIS — Z202 Contact with and (suspected) exposure to infections with a predominantly sexual mode of transmission: Secondary | ICD-10-CM

## 2022-11-17 DIAGNOSIS — R369 Urethral discharge, unspecified: Secondary | ICD-10-CM | POA: Diagnosis not present

## 2022-11-17 MED ORDER — DOXYCYCLINE HYCLATE 100 MG PO CAPS: 100.0000 mg | ORAL_CAPSULE | Freq: Two times a day (BID) | ORAL | 0 refills | Status: DC

## 2022-11-17 MED ORDER — CEFTRIAXONE SODIUM 500 MG IJ SOLR
500.0000 mg | INTRAMUSCULAR | Status: DC
  Administered 2022-11-17: 500 mg via INTRAMUSCULAR

## 2022-11-17 MED ORDER — CEFTRIAXONE SODIUM 500 MG IJ SOLR
INTRAMUSCULAR | Status: AC
  Filled 2022-11-17: qty 500

## 2022-11-17 NOTE — ED Provider Notes (Signed)
[facility]    CSN: [id number] Arrival date & time: 11/17/22  0845      History   Chief Complaint Chief Complaint  Patient presents with   Exposure to STD    HPI Casey Lawson is a 37 y.o. male.   He is here for penile d/c since yesterday.   Partner was here yesterday for similar symptoms, and treated with doxy and rocephin;  her cultures are still pending.  He was here last month, treated for trich and chlamydia.  His symptoms resolved after that treatment.  Would like blood work as well today.          Past Medical History:  Diagnosis Date   ADHD (attention deficit hyperactivity disorder)    Closed left ankle fracture 03/24/2022   Migraine    Skin abnormality 03/24/2022   left ankle scraped area healing with scab, using mupirocin ointment to   Uses crutches     Patient Active Problem List   Diagnosis Date Noted   Ankle fracture, left 04/04/2022   TOOTH BROKEN FRACTURE DUE TO TRAUMA COMPLICATED [id number]    Past Surgical History:  Procedure Laterality Date   arm surgery Left 2010   ORIF ANKLE FRACTURE Left 04/04/2022   Procedure: OPEN REDUCTION INTERNAL FIXATION (ORIF) ANKLE FRACTURE;  Surgeon: Renette Butters, MD;  Location: [facility];  Service: Orthopedics;  Laterality: Left;       Home Medications    Prior to Admission medications   Medication Sig Start Date End Date Taking? Authorizing Provider  aspirin EC 81 MG tablet Take 1 tablet (81 mg total) by mouth 2 (two) times daily. To prevent blood clots for 30 days after surgery. 04/04/22   Britt Bottom, PA-C  baclofen (LIORESAL) 10 MG tablet Take 1 tablet (10 mg total) by mouth 3 (three) times daily as needed for muscle spasms. 04/04/22 04/04/23  Britt Bottom, PA-C  Cephalexin 500 MG tablet Take 500 mg by mouth 3 (three) times daily.    [provider]  HYDROcodone-acetaminophen (NORCO) 10-325 MG tablet Take 1-2 tablets by mouth every 6 (six) hours as needed for  severe pain. 04/04/22   Britt Bottom, PA-C  ondansetron (ZOFRAN-ODT) 4 MG disintegrating tablet Take 1 tablet (4 mg total) by mouth 2 (two) times daily as needed for nausea or vomiting. 04/04/22   Britt Bottom, PA-C    Family History Family History  Problem Relation Age of Onset   Healthy Mother    Healthy Father     Social History Social History   Tobacco Use   Smoking status: Never   Smokeless tobacco: Never   Tobacco comments:    hookah  Vaping Use   Vaping Use: Never used  Substance Use Topics   Alcohol use: Yes    Comment: occ   Drug use: Yes    Frequency: 7.0 times per week    Types: Marijuana    Comment: daily for adhd     Allergies   Patient has no known allergies.   Review of Systems Review of Systems  Constitutional: Negative.   HENT: Negative.    Respiratory: Negative.    Cardiovascular: Negative.   Gastrointestinal: Negative.   Genitourinary:  Positive for penile discharge.  Musculoskeletal: Negative.   Psychiatric/Behavioral: Negative.       Physical Exam Triage Vital Signs ED Triage Vitals [11/17/22 0902]  Enc Vitals Group     BP (!) 147/97     Pulse Rate Marland Kitchen)  59     Resp 18     Temp 97.6 F (36.4 C)     Temp Source Oral     SpO2 98 %     Weight      Height      Head Circumference      Peak Flow      Pain Score      Pain Loc      Pain Edu?      Excl. in [place]?    No data found.  Updated Vital Signs BP (!) 147/97 (BP Location: Left Arm)   Pulse (!) 59   Temp 97.6 F (36.4 C) (Oral)   Resp 18   SpO2 98%   Visual Acuity Right Eye Distance:   Left Eye Distance:   Bilateral Distance:    Right Eye Near:   Left Eye Near:    Bilateral Near:     Physical Exam Constitutional:      Appearance: Normal appearance.  Cardiovascular:     Rate and Rhythm: Normal rate.  Pulmonary:     Effort: Pulmonary effort is normal.  Skin:    General: Skin is warm.  Neurological:     General: No focal deficit present.     Mental Status:  He is alert.  Psychiatric:        Mood and Affect: Mood normal.      UC Treatments / Results  Labs (all labs ordered are listed, but only abnormal results are displayed) Labs Reviewed  HIV ANTIBODY (ROUTINE TESTING W REFLEX)  RPR  CYTOLOGY, (ORAL, ANAL, URETHRAL) ANCILLARY ONLY    EKG   Radiology No results found.  Procedures Procedures (including critical care time)  Medications Ordered in UC Medications  cefTRIAXone (ROCEPHIN) injection 500 mg (500 mg Intramuscular Given 11/17/22 0933)    Initial Impression / Assessment and Plan / UC Course  I have reviewed the triage vital signs and the nursing notes.  Pertinent labs & imaging results that were available during my care of the patient were reviewed by me and considered in my medical decision making (see chart for details).   Final Clinical Impressions(s) / UC Diagnoses   Final diagnoses:  Penile discharge  Possible exposure to STD     Discharge Instructions      You were seen today for concern for STD.  I have given you a shot of rocephin today for possible gonorrhea.  I have sent doxycycline to the pharmacy for possible chlamydia.  The swab and blood work will be resulted tomorrow.  If anything else needs to be treated we will call and notify you.  Avoid intercourse of any kind until after treatment is completed.     ED Prescriptions     Medication Sig Dispense Auth. Provider   doxycycline (VIBRAMYCIN) 100 MG capsule Take 1 capsule (100 mg total) by mouth 2 (two) times daily. 20 capsule Rondel Oh, MD      PDMP not reviewed this encounter.   Rondel Oh, MD 11/17/22 [phone]

## 2022-11-17 NOTE — ED Triage Notes (Signed)
Pt is here for yellow penile discharge x 2 days. Pt request STD-testing.

## 2022-11-17 NOTE — Discharge Instructions (Addendum)
You were seen today for concern for STD.  I have given you a shot of rocephin today for possible gonorrhea.  I have sent doxycycline to the pharmacy for possible chlamydia.  The swab and blood work will be resulted tomorrow.  If anything else needs to be treated we will call and notify you.  Avoid intercourse of any kind until after treatment is completed.

## 2022-11-20 LAB — CYTOLOGY, (ORAL, ANAL, URETHRAL) ANCILLARY ONLY
Chlamydia: NEGATIVE
Comment: NEGATIVE
Comment: NEGATIVE
Comment: NORMAL
Neisseria Gonorrhea: NEGATIVE
Trichomonas: NEGATIVE

## 2022-12-11 ENCOUNTER — Encounter (HOSPITAL_COMMUNITY): Payer: Self-pay | Admitting: Emergency Medicine

## 2022-12-11 ENCOUNTER — Ambulatory Visit (HOSPITAL_COMMUNITY)
Admission: EM | Admit: 2022-12-11 | Discharge: 2022-12-11 | Disposition: A | Discharge status: Home / Self Care | Payer: Medicaid Other | Attending: Family Medicine | Admitting: Family Medicine

## 2022-12-11 DIAGNOSIS — Z113 Encounter for screening for infections with a predominantly sexual mode of transmission: Secondary | ICD-10-CM | POA: Insufficient documentation

## 2022-12-11 NOTE — Discharge Instructions (Addendum)
We have sent testing for sexually transmitted infections. We will notify you of any positive results once they are received. If required, we will prescribe any medications you might need.  Please refrain from all sexual activity for at least the next seven days.  

## 2022-12-11 NOTE — ED Triage Notes (Signed)
Pt requesting STD testing. Denies exposure or s/s.  

## 2022-12-11 NOTE — ED Provider Notes (Signed)
  [facility]   [id number] 12/11/22 Arrival Time: 0905  ASSESSMENT & PLAN:  1. Screening for STDs (sexually transmitted diseases)    No empiric treatment. Declines HIV/RPR.    Discharge Instructions      We have sent testing for sexually transmitted infections. We will notify you of any positive results once they are received. If required, we will prescribe any medications you might need.  Please refrain from all sexual activity for at least the next seven days.     Pending: Labs Reviewed  CYTOLOGY, (ORAL, ANAL, URETHRAL) ANCILLARY ONLY    Will notify of any positive results. Instructed to refrain from sexual activity for at least seven days.  Reviewed expectations re: course of current medical issues. Questions answered. Outlined signs and symptoms indicating need for more acute intervention. Patient verbalized understanding. After Visit Summary given.   SUBJECTIVE:  Casey Lawson is a 37 y.o. male who requests STI testing. New partner. Otherwise well.  OBJECTIVE:  Vitals:   12/11/22 0957  BP: (!) 154/88  Pulse: (!) 56  Resp: 14  Temp: 98.1 F (36.7 C)  TempSrc: Oral  SpO2: 97%     General appearance: alert, cooperative, appears stated age and no distress Throat: lips, mucosa, and tongue normal; teeth and gums normal GU: deferred Skin: warm and dry Psychological: alert and cooperative; normal mood and affect.    Labs Reviewed  CYTOLOGY, (ORAL, ANAL, URETHRAL) ANCILLARY ONLY    No Known Allergies  Past Medical History:  Diagnosis Date   ADHD (attention deficit hyperactivity disorder)    Closed left ankle fracture 03/24/2022   Migraine    Skin abnormality 03/24/2022   left ankle scraped area healing with scab, using mupirocin ointment to   Uses crutches    Family History  Problem Relation Age of Onset   Healthy Mother    Healthy Father    Social History   Socioeconomic History   Marital status: Single    Spouse name: Not on  file   Number of children: Not on file   Years of education: Not on file   Highest education level: Not on file  Occupational History   Not on file  Tobacco Use   Smoking status: Never   Smokeless tobacco: Never   Tobacco comments:    hookah  Vaping Use   Vaping Use: Never used  Substance and Sexual Activity   Alcohol use: Yes    Comment: occ   Drug use: Yes    Frequency: 7.0 times per week    Types: Marijuana    Comment: daily for adhd   Sexual activity: Yes    Birth control/protection: None  Other Topics Concern   Not on file  Social History Narrative   Not on file   Social Determinants of Health   Financial Resource Strain: Not on file  Food Insecurity: Not on file  Transportation Needs: Not on file  Physical Activity: Not on file  Stress: Not on file  Social Connections: Not on file  Intimate Partner Violence: Not on file           [place], MD 12/11/22 1037

## 2022-12-12 LAB — CYTOLOGY, (ORAL, ANAL, URETHRAL) ANCILLARY ONLY
Chlamydia: NEGATIVE
Comment: NEGATIVE
Comment: NEGATIVE
Comment: NORMAL
Neisseria Gonorrhea: NEGATIVE
Trichomonas: POSITIVE — AB

## 2022-12-14 ENCOUNTER — Telehealth: Payer: Self-pay | Admitting: Emergency Medicine

## 2022-12-14 MED ORDER — METRONIDAZOLE 500 MG PO TABS: 2000.0000 mg | ORAL_TABLET | Freq: Once | ORAL | 0 refills | Status: AC

## 2023-09-30 ENCOUNTER — Ambulatory Visit (HOSPITAL_COMMUNITY)
Admission: RE | Admit: 2023-09-30 | Discharge: 2023-09-30 | Disposition: A | Discharge status: Home / Self Care | Payer: No Typology Code available for payment source | Source: Ambulatory Visit | Attending: Internal Medicine | Admitting: Internal Medicine

## 2023-09-30 ENCOUNTER — Encounter (HOSPITAL_COMMUNITY): Payer: Self-pay

## 2023-09-30 VITALS — BP 114/80 | HR 54 | Temp 97.8°F | Resp 18

## 2023-09-30 DIAGNOSIS — Z202 Contact with and (suspected) exposure to infections with a predominantly sexual mode of transmission: Secondary | ICD-10-CM | POA: Insufficient documentation

## 2023-09-30 MED ORDER — DOXYCYCLINE HYCLATE 100 MG PO CAPS: 100.0000 mg | ORAL_CAPSULE | Freq: Two times a day (BID) | ORAL | 0 refills | Status: DC

## 2023-09-30 NOTE — ED Triage Notes (Signed)
Patient states him and is lady friend had a threesome and was expose to chlamydia. Denies any symptoms.

## 2023-09-30 NOTE — Discharge Instructions (Addendum)
Screening swab done today and results will be available in 24-48 hours. We will contact you if we need to arrange additional treatment based on your testing. We will send in Doxycycline 100mg  twice daily for 7 days given the known exposure to chlamydia.  Negative results will be on your MyChart account. Abstain from sex until you receive your final results.  Use a condom for sexual encounters. If you have any worsening or changing symptoms including abnormal discharge, pelvic pain, abdominal pain, fever, nausea, or vomiting, then you should be reevaluated.

## 2023-09-30 NOTE — ED Provider Notes (Signed)
[facility]    CSN: [id number] Arrival date & time: 09/30/23  1216      History   Chief Complaint Chief Complaint  Patient presents with   Exposure to STD    HPI Casey Lawson is a 38 y.o. male.   38 y.o. male who presents to urgent care with complaints of exposure to chlamydia.  He reports that he and his lady friend had a threesome.  His lady friend has tested positive for chlamydia and is being treated.  He denies any penile discharge, penile pain, dysuria, hematuria, fevers, lower abdominal pain.  He would like to be treated for chlamydia as well.  He would also like STI testing today.   Exposure to STD Pertinent negatives include no chest pain, no abdominal pain and no shortness of breath.    Past Medical History:  Diagnosis Date   ADHD (attention deficit hyperactivity disorder)    Closed left ankle fracture 03/24/2022   Migraine    Skin abnormality 03/24/2022   left ankle scraped area healing with scab, using mupirocin ointment to   Uses crutches     Patient Active Problem List   Diagnosis Date Noted   Ankle fracture, left 04/04/2022   TOOTH BROKEN FRACTURE DUE TO TRAUMA COMPLICATED 12/17/2009    Past Surgical History:  Procedure Laterality Date   arm surgery Left 2010   ORIF ANKLE FRACTURE Left 04/04/2022   Procedure: OPEN REDUCTION INTERNAL FIXATION (ORIF) ANKLE FRACTURE;  Surgeon: Sheral Apley, MD;  Location: [facility] ;  Service: Orthopedics;  Laterality: Left;       Home Medications    Prior to Admission medications   Medication Sig Start Date End Date Taking? Authorizing Provider  doxycycline (VIBRAMYCIN) 100 MG capsule Take 1 capsule (100 mg total) by mouth 2 (two) times daily for 7 days. 09/30/23 10/07/23 Yes Emilija Bohman A, PA-C  HYDROcodone-acetaminophen (NORCO) 10-325 MG tablet Take 1-2 tablets by mouth every 6 (six) hours as needed for severe pain. 04/04/22   Jenne Pane, PA-C    Family History Family  History  Problem Relation Age of Onset   Healthy Mother    Healthy Father     Social History Social History   Tobacco Use   Smoking status: Never   Smokeless tobacco: Never   Tobacco comments:    hookah  Vaping Use   Vaping status: Never Used  Substance Use Topics   Alcohol use: Yes    Comment: occ   Drug use: Yes    Frequency: 7.0 times per week    Types: Marijuana    Comment: daily for adhd     Allergies   Patient has no known allergies.   Review of Systems Review of Systems  Constitutional:  Negative for chills and fever.  HENT:  Negative for ear pain and sore throat.   Eyes:  Negative for pain and visual disturbance.  Respiratory:  Negative for cough and shortness of breath.   Cardiovascular:  Negative for chest pain and palpitations.  Gastrointestinal:  Negative for abdominal pain and vomiting.  Genitourinary:  Negative for dysuria and hematuria.  Musculoskeletal:  Negative for arthralgias and back pain.  Skin:  Negative for color change and rash.  Neurological:  Negative for seizures and syncope.  All other systems reviewed and are negative.    Physical Exam Triage Vital Signs ED Triage Vitals  Encounter Vitals Group     BP 09/30/23 1306 114/80     Systolic BP  Percentile --      Diastolic BP Percentile --      Pulse Rate 09/30/23 1306 (!) 54     Resp 09/30/23 1306 18     Temp 09/30/23 1306 97.8 F (36.6 C)     Temp Source 09/30/23 1306 Oral     SpO2 09/30/23 1306 98 %     Weight --      Height --      Head Circumference --      Peak Flow --      Pain Score 09/30/23 1311 0     Pain Loc --      Pain Education --      Exclude from Growth Chart --    No data found.  Updated Vital Signs BP 114/80 (BP Location: Left Arm)   Pulse (!) 54   Temp 97.8 F (36.6 C) (Oral)   Resp 18   SpO2 98%   Visual Acuity Right Eye Distance:   Left Eye Distance:   Bilateral Distance:    Right Eye Near:   Left Eye Near:    Bilateral Near:     Physical  Exam Vitals and nursing note reviewed.  Constitutional:      General: He is not in acute distress.    Appearance: He is well-developed.  HENT:     Head: Normocephalic and atraumatic.  Eyes:     Conjunctiva/sclera: Conjunctivae normal.  Cardiovascular:     Rate and Rhythm: Normal rate and regular rhythm.     Heart sounds: No murmur heard. Pulmonary:     Effort: Pulmonary effort is normal. No respiratory distress.     Breath sounds: Normal breath sounds.  Abdominal:     Palpations: Abdomen is soft.     Tenderness: There is no abdominal tenderness.  Musculoskeletal:        General: No swelling.     Cervical back: Neck supple.  Skin:    General: Skin is warm and dry.     Capillary Refill: Capillary refill takes less than 2 seconds.  Neurological:     Mental Status: He is alert.  Psychiatric:        Mood and Affect: Mood normal.      UC Treatments / Results  Labs (all labs ordered are listed, but only abnormal results are displayed) Labs Reviewed  CYTOLOGY, (ORAL, ANAL, URETHRAL) ANCILLARY ONLY    EKG   Radiology No results found.  Procedures Procedures (including critical care time)  Medications Ordered in UC Medications - No data to display  Initial Impression / Assessment and Plan / UC Course  I have reviewed the triage vital signs and the nursing notes.  Pertinent labs & imaging results that were available during my care of the patient were reviewed by me and considered in my medical decision making (see chart for details).     STD exposure   Screening swab done today and results will be available in 24-48 hours. We will contact you if we need to arrange additional treatment based on your testing. We will send in Doxycycline 100mg  twice daily for 7 days given the known exposure to chlamydia.  Negative results will be on your MyChart account. Abstain from sex until you receive your final results.  Use a condom for sexual encounters. If you have any worsening  or changing symptoms including abnormal discharge, pelvic pain, abdominal pain, fever, nausea, or vomiting, then you should be reevaluated.    Final Clinical Impressions(s) / UC Diagnoses  Final diagnoses:  STD exposure     Discharge Instructions      Screening swab done today and results will be available in 24-48 hours. We will contact you if we need to arrange additional treatment based on your testing. We will send in Doxycycline 100mg  twice daily for 7 days given the known exposure to chlamydia.  Negative results will be on your MyChart account. Abstain from sex until you receive your final results.  Use a condom for sexual encounters. If you have any worsening or changing symptoms including abnormal discharge, pelvic pain, abdominal pain, fever, nausea, or vomiting, then you should be reevaluated.     ED Prescriptions     Medication Sig Dispense Auth. Provider   doxycycline (VIBRAMYCIN) 100 MG capsule Take 1 capsule (100 mg total) by mouth 2 (two) times daily for 7 days. 14 capsule Landis Martins, [place]      PDMP not reviewed this encounter.   Landis Martins, [place] 09/30/23 1335

## 2023-10-02 ENCOUNTER — Telehealth: Payer: Self-pay

## 2023-10-02 LAB — CYTOLOGY, (ORAL, ANAL, URETHRAL) ANCILLARY ONLY
Chlamydia: POSITIVE — AB
Comment: NEGATIVE
Comment: NEGATIVE
Comment: NORMAL
Neisseria Gonorrhea: NEGATIVE
Trichomonas: POSITIVE — AB

## 2023-10-02 MED ORDER — METRONIDAZOLE 500 MG PO TABS: 2000.0000 mg | ORAL_TABLET | Freq: Once | ORAL | 0 refills | Status: AC

## 2023-10-02 NOTE — Telephone Encounter (Signed)
Per protocol, pt requires tx with metronidazole. Reviewed with patient, verified pharmacy, prescription sent.

## 2023-10-07 ENCOUNTER — Encounter (HOSPITAL_COMMUNITY): Payer: Self-pay

## 2023-10-07 ENCOUNTER — Ambulatory Visit (HOSPITAL_COMMUNITY)
Admission: EM | Admit: 2023-10-07 | Discharge: 2023-10-07 | Disposition: A | Discharge status: Home / Self Care | Payer: Medicaid Other

## 2023-10-07 VITALS — BP 151/94 | HR 56 | Temp 97.5°F | Resp 14

## 2023-10-07 DIAGNOSIS — Z711 Person with feared health complaint in whom no diagnosis is made: Secondary | ICD-10-CM

## 2023-10-07 NOTE — Discharge Instructions (Addendum)
As discussed it is recommended that we retest 4 weeks after completion of treatment.  If you develop symptoms you can return here sooner for retesting.

## 2023-10-07 NOTE — ED Provider Notes (Signed)
[facility]    CSN: [id number] Arrival date & time: 10/07/23  1202      History   Chief Complaint Chief Complaint  Patient presents with   Appointment    HPI Casey Lawson is a 38 y.o. male.   Patient presents for retesting after finishing treatment for chlamydia and trichomonas today.  Denies any symptoms.     Past Medical History:  Diagnosis Date   ADHD (attention deficit hyperactivity disorder)    Closed left ankle fracture 03/24/2022   Migraine    Skin abnormality 03/24/2022   left ankle scraped area healing with scab, using mupirocin ointment to   Uses crutches     Patient Active Problem List   Diagnosis Date Noted   Ankle fracture, left 04/04/2022   TOOTH BROKEN FRACTURE DUE TO TRAUMA COMPLICATED 12/17/2009    Past Surgical History:  Procedure Laterality Date   arm surgery Left 2010   ORIF ANKLE FRACTURE Left 04/04/2022   Procedure: OPEN REDUCTION INTERNAL FIXATION (ORIF) ANKLE FRACTURE;  Surgeon: Sheral Apley, MD;  Location: [facility];  Service: Orthopedics;  Laterality: Left;       Home Medications    Prior to Admission medications   Medication Sig Start Date End Date Taking? Authorizing Provider  metroNIDAZOLE (FLAGYL) 500 MG tablet Take 500 mg by mouth 2 (two) times daily. 10/02/23  Yes [provider]    Family History Family History  Problem Relation Age of Onset   Healthy Mother    Healthy Father     Social History Social History   Tobacco Use   Smoking status: Never   Smokeless tobacco: Never   Tobacco comments:    hookah  Vaping Use   Vaping status: Never Used  Substance Use Topics   Alcohol use: Yes    Comment: occ   Drug use: Yes    Frequency: 7.0 times per week    Types: Marijuana    Comment: daily for adhd     Allergies   Patient has no known allergies.   Review of Systems Review of Systems  Constitutional:  Negative for fever.  Gastrointestinal:  Negative for abdominal  pain.  Genitourinary:  Negative for dysuria, hematuria, penile discharge, penile pain and penile swelling.     Physical Exam Triage Vital Signs ED Triage Vitals [10/07/23 1222]  Encounter Vitals Group     BP (!) 151/94     Systolic BP Percentile      Diastolic BP Percentile      Pulse Rate (!) 56     Resp 14     Temp (!) 97.5 F (36.4 C)     Temp Source Oral     SpO2 98 %     Weight      Height      Head Circumference      Peak Flow      Pain Score 0     Pain Loc      Pain Education      Exclude from Growth Chart    No data found.  Updated Vital Signs BP (!) 151/94 (BP Location: Right Arm)   Pulse (!) 56   Temp (!) 97.5 F (36.4 C) (Oral)   Resp 14   SpO2 98%   Visual Acuity Right Eye Distance:   Left Eye Distance:   Bilateral Distance:    Right Eye Near:   Left Eye Near:    Bilateral Near:     Physical Exam  Vitals and nursing note reviewed.  Constitutional:      General: He is awake. He is not in acute distress.    Appearance: Normal appearance. He is well-developed and well-groomed. He is not ill-appearing.  Genitourinary:    Comments: Exam deferred due to to lack of symptoms Neurological:     Mental Status: He is alert.  Psychiatric:        Behavior: Behavior is cooperative.      UC Treatments / Results  Labs (all labs ordered are listed, but only abnormal results are displayed) Labs Reviewed - No data to display  EKG   Radiology No results found.  Procedures Procedures (including critical care time)  Medications Ordered in UC Medications - No data to display  Initial Impression / Assessment and Plan / UC Course  I have reviewed the triage vital signs and the nursing notes.  Pertinent labs & imaging results that were available during my care of the patient were reviewed by me and considered in my medical decision making (see chart for details).     Patient presented for retesting to ensure that chlamydia and trichomonas have been  treated.  States that he finished treatment today.  Denies any symptoms.  No significant findings upon assessment.  GU exam deferred due to lack of symptoms.  Retesting deferred due to recommendations to retest 4 weeks after treatment is finished  Discussed follow-up and return precautions. Final Clinical Impressions(s) / UC Diagnoses   Final diagnoses:  Concern about STD in male without diagnosis     Discharge Instructions      As discussed it is recommended that we retest 4 weeks after completion of treatment.  If you develop symptoms you can return here sooner for retesting.    ED Prescriptions   None    PDMP not reviewed this encounter.   Wynonia Lawman A, NP 10/07/23 1414

## 2023-10-07 NOTE — ED Triage Notes (Signed)
Pt reports that he was seen here 1/26 and was positive for chlamydia and trich and finished all medications. Pt requesting to be re-tested to make sure he is good.

## 2024-02-16 ENCOUNTER — Ambulatory Visit (HOSPITAL_COMMUNITY)

## 2024-03-03 ENCOUNTER — Encounter (HOSPITAL_COMMUNITY): Payer: Self-pay

## 2024-03-03 ENCOUNTER — Ambulatory Visit (HOSPITAL_COMMUNITY)
Admission: EM | Admit: 2024-03-03 | Discharge: 2024-03-03 | Disposition: A | Discharge status: Home / Self Care | Attending: Internal Medicine | Admitting: Internal Medicine

## 2024-03-03 DIAGNOSIS — L03011 Cellulitis of right finger: Secondary | ICD-10-CM | POA: Diagnosis not present

## 2024-03-03 MED ORDER — AMOXICILLIN-POT CLAVULANATE 875-125 MG PO TABS: 1.0000 | ORAL_TABLET | Freq: Two times a day (BID) | ORAL | 0 refills | Status: DC

## 2024-03-03 NOTE — ED Notes (Signed)
No answer from lobby  

## 2024-03-03 NOTE — ED Triage Notes (Signed)
 Patient presenting with right middle finger pain and swelling onset 1.5 weeks ago. No known cuts or injuries. States the pain got worse after bowling.   Prescriptions or OTC medications tried: Yes- otc anti-inflammatories    with little relief.

## 2024-03-03 NOTE — ED Provider Notes (Signed)
 [facility]    CSN: [id number] Arrival date & time: 03/03/24  0815      History   Chief Complaint Chief Complaint  Patient presents with   Finger Injury    HPI Casey Lawson is a 38 y.o. male.   Casey Lawson is a 38 y.o. male presenting for chief complaint of pain and swelling of the proximal nail fold at the right middle finger that started approximately 1 week ago after he went bowling. Denies known trauma/crushing injuries to the finger.  States pain, redness, and swelling have gradually worsened over the last 1 week.  He is now seeing some yellow drainage underneath the skin that has yet to come out of the skin in the last 1 to 2 days.  Denies fever, chills, streaking redness to the top of the affected digit, numbness and tingling distally to pain/swelling, and fever/chills.  Denies recent antibiotic or steroid use in the last 90 days taking over-the-counter medications for pain with minimal relief.     Past Medical History:  Diagnosis Date   ADHD (attention deficit hyperactivity disorder)    Closed left ankle fracture 03/24/2022   Migraine    Skin abnormality 03/24/2022   left ankle scraped area healing with scab, using mupirocin  ointment to   Uses crutches     Patient Active Problem List   Diagnosis Date Noted   Ankle fracture, left 04/04/2022   TOOTH BROKEN FRACTURE DUE TO TRAUMA COMPLICATED 12/17/2009    Past Surgical History:  Procedure Laterality Date   arm surgery Left 2010   ORIF ANKLE FRACTURE Left 04/04/2022   Procedure: OPEN REDUCTION INTERNAL FIXATION (ORIF) ANKLE FRACTURE;  Surgeon: Beverley Evalene BIRCH, MD;  Location: [facility] ;  Service: Orthopedics;  Laterality: Left;       Home Medications    Prior to Admission medications   Medication Sig Start Date End Date Taking? Authorizing Provider  amoxicillin-clavulanate (AUGMENTIN) 875-125 MG tablet Take 1 tablet by mouth every 12 (twelve) hours. 03/03/24  Yes Enedelia Dorna HERO, FNP  metroNIDAZOLE  (FLAGYL ) 500 MG tablet Take 500 mg by mouth 2 (two) times daily. 10/02/23   [provider]    Family History Family History  Problem Relation Age of Onset   Healthy Mother    Healthy Father     Social History Social History   Tobacco Use   Smoking status: Never   Smokeless tobacco: Never   Tobacco comments:    hookah  Vaping Use   Vaping status: Never Used  Substance Use Topics   Alcohol use: Yes    Comment: occ   Drug use: Yes    Frequency: 7.0 times per week    Types: Marijuana    Comment: daily for adhd     Allergies   Patient has no known allergies.   Review of Systems Review of Systems Per HPI  Physical Exam Triage Vital Signs ED Triage Vitals  Encounter Vitals Group     BP 03/03/24 0910 (!) 146/98     Girls Systolic BP Percentile --      Girls Diastolic BP Percentile --      Boys Systolic BP Percentile --      Boys Diastolic BP Percentile --      Pulse Rate 03/03/24 0910 63     Resp 03/03/24 0910 18     Temp 03/03/24 0910 98 F (36.7 C)     Temp Source 03/03/24 0910 Oral  SpO2 03/03/24 0910 97 %     Weight 03/03/24 0911 150 lb (68 kg)     Height 03/03/24 0911 5' 8 (1.727 m)     Head Circumference --      Peak Flow --      Pain Score 03/03/24 0910 5     Pain Loc --      Pain Education --      Exclude from Growth Chart --    No data found.  Updated Vital Signs BP (!) 146/98 (BP Location: Left Arm)   Pulse 63   Temp 98 F (36.7 C) (Oral)   Resp 18   Ht 5' 8 (1.727 m)   Wt 150 lb (68 kg)   SpO2 97%   BMI 22.81 kg/m   Visual Acuity Right Eye Distance:   Left Eye Distance:   Bilateral Distance:    Right Eye Near:   Left Eye Near:    Bilateral Near:     Physical Exam Vitals and nursing note reviewed.  Constitutional:      Appearance: He is not ill-appearing or toxic-appearing.  HENT:     Head: Normocephalic and atraumatic.     Right Ear: Hearing and external ear normal.     Left  Ear: Hearing and external ear normal.     Nose: Nose normal.     Mouth/Throat:     Lips: Pink.   Eyes:     General: Lids are normal. Vision grossly intact. Gaze aligned appropriately.     Extraocular Movements: Extraocular movements intact.     Conjunctiva/sclera: Conjunctivae normal.   Pulmonary:     Effort: Pulmonary effort is normal.   Musculoskeletal:     Cervical back: Neck supple.   Skin:    General: Skin is warm and dry.     Capillary Refill: Capillary refill takes less than 2 seconds.     Findings: Abscess (Paronychia is nonfluctuant.) and erythema present. No rash.     Comments: Paronychia of right middle finger at the proximal nail fold as seen in image below.  Less than 2 cap refill of affected digit, strength and sensation intact distally.   Neurological:     General: No focal deficit present.     Mental Status: He is alert and oriented to person, place, and time. Mental status is at baseline.     Cranial Nerves: No dysarthria or facial asymmetry.   Psychiatric:        Mood and Affect: Mood normal.        Speech: Speech normal.        Behavior: Behavior normal.        Thought Content: Thought content normal.        Judgment: Judgment normal.    Right middle finger swelling at the proximal nail fold   UC Treatments / Results  Labs (all labs ordered are listed, but only abnormal results are displayed) Labs Reviewed - No data to display  EKG   Radiology No results found.  Procedures Procedures (including critical care time)  Medications Ordered in UC Medications - No data to display  Initial Impression / Assessment and Plan / UC Course  I have reviewed the triage vital signs and the nursing notes.  Pertinent labs & imaging results that were available during my care of the patient were reviewed by me and considered in my medical decision making (see chart for details).   1.  Paronychia of finger of right hand, cellulitis of right  middle  finger Incision and drainage with 18-gauge needle and pain ease spray for paronychia performed without much drainage from site. Will treat with Augmentin twice daily for 7 days. Epsom salt soaks encouraged.  Tylenol  as needed for pain. Deferred imaging of the hand given lack of trauma to cause infection/paronychia. May follow-up with PCP/urgent care as needed. Infection return precautions discussed.  Counseled patient on potential for adverse effects with medications prescribed/recommended today, strict ER and return-to-clinic precautions discussed, patient verbalized understanding.    Final Clinical Impressions(s) / UC Diagnoses   Final diagnoses:  Paronychia of finger of right hand  Cellulitis of right middle finger     Discharge Instructions      You have cellulitis which is a superficial skin infection.  Take antibiotic every 12 hours with a snack/food for the next 7 days. Watch for worsening signs of infection to the lesion such as spreading redness, swelling, pus drainage, pain, fever/chills. Return if you develop streaking redness to the finger, fever, chills, nausea, vomiting, etc as well.  If you develop any new or worsening symptoms or if your symptoms do not start to improve, please return here or follow-up with your primary care provider. If your symptoms are severe, please go to the emergency room.    ED Prescriptions     Medication Sig Dispense Auth. Provider   amoxicillin-clavulanate (AUGMENTIN) 875-125 MG tablet Take 1 tablet by mouth every 12 (twelve) hours. 14 tablet Enedelia Dorna HERO, FNP      PDMP not reviewed this encounter.   Enedelia Dorna HERO, [place] 03/03/24 1147

## 2024-03-03 NOTE — Discharge Instructions (Signed)
 You have cellulitis which is a superficial skin infection.  Take antibiotic every 12 hours with a snack/food for the next 7 days. Watch for worsening signs of infection to the lesion such as spreading redness, swelling, pus drainage, pain, fever/chills. Return if you develop streaking redness to the finger, fever, chills, nausea, vomiting, etc as well.  If you develop any new or worsening symptoms or if your symptoms do not start to improve, please return here or follow-up with your primary care provider. If your symptoms are severe, please go to the emergency room.

## 2024-04-04 ENCOUNTER — Encounter (HOSPITAL_COMMUNITY): Payer: Self-pay

## 2024-04-04 ENCOUNTER — Ambulatory Visit (HOSPITAL_COMMUNITY)
Admission: EM | Admit: 2024-04-04 | Discharge: 2024-04-04 | Disposition: A | Discharge status: Home / Self Care | Attending: Nurse Practitioner | Admitting: Nurse Practitioner

## 2024-04-04 DIAGNOSIS — R030 Elevated blood-pressure reading, without diagnosis of hypertension: Secondary | ICD-10-CM

## 2024-04-04 DIAGNOSIS — K047 Periapical abscess without sinus: Secondary | ICD-10-CM

## 2024-04-04 MED ORDER — NAPROXEN 500 MG PO TABS: 500.0000 mg | ORAL_TABLET | Freq: Two times a day (BID) | ORAL | 0 refills | Status: DC

## 2024-04-04 MED ORDER — KETOROLAC TROMETHAMINE 60 MG/2ML IM SOLN
60.0000 mg | Freq: Once | INTRAMUSCULAR | Status: AC
  Administered 2024-04-04: 60 mg via INTRAMUSCULAR

## 2024-04-04 MED ORDER — KETOROLAC TROMETHAMINE 60 MG/2ML IM SOLN
INTRAMUSCULAR | Status: AC
  Filled 2024-04-04: qty 2

## 2024-04-04 MED ORDER — OXYCODONE HCL 5 MG PO TABS: 5.0000 mg | ORAL_TABLET | ORAL | 0 refills | Status: DC | PRN

## 2024-04-04 MED ORDER — CHLORHEXIDINE GLUCONATE 0.12 % MT SOLN: 15.0000 mL | OROMUCOSAL | 0 refills | Status: AC

## 2024-04-04 MED ORDER — AMOXICILLIN-POT CLAVULANATE 875-125 MG PO TABS: 1.0000 | ORAL_TABLET | Freq: Two times a day (BID) | ORAL | 0 refills | Status: DC

## 2024-04-04 NOTE — ED Provider Notes (Signed)
 [facility]    CSN: [id number] Arrival date & time: 04/04/24  1514      History   Chief Complaint Chief Complaint  Patient presents with   Dental Pain    HPI Casey Lawson is a 38 y.o. male.   Discussed the use of AI scribe software for clinical note transcription with the patient, who gave verbal consent to proceed.   The patient presents with severe dental pain and swelling on the left side of the mouth, ongoing for 3 days. The pain affects the entire upper left side of the mouth, including the teeth, gums, and face. The patient reports associated symptoms of night sweats and headache. The pain is exacerbated by chewing. The patient has been attempting to manage the pain with Goody Powder, but with limited relief. He reports difficulty sleeping due to discomfort. The patient reports being a smoker and lacks dental insurance or a regular dentist.   The following portions of the patient's history were reviewed and updated as appropriate: allergies, current medications, past family history, past medical history, past social history, past surgical history, and problem list.      Past Medical History:  Diagnosis Date   ADHD (attention deficit hyperactivity disorder)    Closed left ankle fracture 03/24/2022   Migraine    Skin abnormality 03/24/2022   left ankle scraped area healing with scab, using mupirocin  ointment to   Uses crutches     Patient Active Problem List   Diagnosis Date Noted   Ankle fracture, left 04/04/2022   TOOTH BROKEN FRACTURE DUE TO TRAUMA COMPLICATED 12/17/2009    Past Surgical History:  Procedure Laterality Date   arm surgery Left 2010   ORIF ANKLE FRACTURE Left 04/04/2022   Procedure: OPEN REDUCTION INTERNAL FIXATION (ORIF) ANKLE FRACTURE;  Surgeon: Beverley Evalene BIRCH, MD;  Location: [facility];  Service: Orthopedics;  Laterality: Left;       Home Medications    Prior to Admission medications   Medication Sig Start  Date End Date Taking? Authorizing Provider  amoxicillin -clavulanate (AUGMENTIN ) 875-125 MG tablet Take 1 tablet by mouth 2 (two) times daily after a meal. 04/04/24  Yes Iola Lukes, FNP  chlorhexidine (PERIDEX) 0.12 % solution Use as directed 15 mLs in the mouth or throat 2 (two) times daily at 8 am and 6 pm for 7 days. Brush teeth then swish in mouth for 2 minutes then spit out. Do not rinse mouth after use. Avoid eating, drinking, smoking and vaping for at least 30 minutes after use 04/04/24 04/11/24 Yes Vonnie Spagnolo, FNP  naproxen  (NAPROSYN ) 500 MG tablet Take 1 tablet (500 mg total) by mouth 2 (two) times daily with a meal. Take with food to avoid stomach upset. Do not take any additional NSAIDs while on this. You may take tylenol  in addition to this if needed for extra pain relief. 04/04/24  Yes Iola Lukes, FNP  oxyCODONE (ROXICODONE) 5 MG immediate release tablet Take 1 tablet (5 mg total) by mouth every 4 (four) hours as needed for severe pain (pain score 7-10). 04/04/24  Yes Iola Lukes, FNP    Family History Family History  Problem Relation Age of Onset   Healthy Mother    Healthy Father     Social History Social History   Tobacco Use   Smoking status: Never   Smokeless tobacco: Never   Tobacco comments:    hookah  Vaping Use   Vaping status: Never Used  Substance Use Topics  Alcohol use: Yes    Comment: occ   Drug use: Yes    Frequency: 7.0 times per week    Types: Marijuana    Comment: daily for adhd     Allergies   Patient has no known allergies.   Review of Systems Review of Systems  Constitutional:  Positive for diaphoresis. Negative for fever.  HENT:  Positive for dental problem and facial swelling.   Neurological:  Positive for headaches. Negative for dizziness.  Psychiatric/Behavioral:  Positive for sleep disturbance.   All other systems reviewed and are negative.    Physical Exam Triage Vital Signs ED Triage Vitals  Encounter Vitals  Group     BP 04/04/24 1537 (!) 178/101     Girls Systolic BP Percentile --      Girls Diastolic BP Percentile --      Boys Systolic BP Percentile --      Boys Diastolic BP Percentile --      Pulse Rate 04/04/24 1537 (!) 59     Resp 04/04/24 1537 18     Temp 04/04/24 1537 97.6 F (36.4 C)     Temp Source 04/04/24 1537 Oral     SpO2 04/04/24 1537 98 %     Weight --      Height --      Head Circumference --      Peak Flow --      Pain Score 04/04/24 1538 10     Pain Loc --      Pain Education --      Exclude from Growth Chart --    No data found.  Updated Vital Signs BP (!) 178/101 (BP Location: Left Arm)   Pulse (!) 59   Temp 97.6 F (36.4 C) (Oral)   Resp 18   SpO2 98%   Visual Acuity Right Eye Distance:   Left Eye Distance:   Bilateral Distance:    Right Eye Near:   Left Eye Near:    Bilateral Near:     Physical Exam Vitals reviewed.  Constitutional:      General: He is awake. He is not in acute distress.    Appearance: Normal appearance. He is well-developed. He is not ill-appearing, toxic-appearing or diaphoretic.     Comments: Patient in obvious discomfort but no acute distress  HENT:     Head: Normocephalic.     Jaw: There is normal jaw occlusion.     Comments: Left facial swelling noted without any erythema    Right Ear: Hearing normal.     Left Ear: Hearing normal.     Nose: Nose normal.     Mouth/Throat:     Mouth: Mucous membranes are moist. No oral lesions.     Dentition: Dental tenderness, gingival swelling and dental caries present. No dental abscesses or gum lesions.     Tongue: No lesions.     Palate: No lesions.     Pharynx: Oropharynx is clear. Uvula midline.  Eyes:     General: Vision grossly intact.     Conjunctiva/sclera: Conjunctivae normal.  Cardiovascular:     Rate and Rhythm: Normal rate and regular rhythm.     Heart sounds: Normal heart sounds.  Pulmonary:     Effort: Pulmonary effort is normal.     Breath sounds: Normal breath  sounds and air entry.  Musculoskeletal:        General: Normal range of motion.     Cervical back: Full passive range of motion without  pain, normal range of motion and neck supple.  Skin:    General: Skin is warm and dry.  Neurological:     General: No focal deficit present.     Mental Status: He is alert and oriented to person, place, and time.  Psychiatric:        Speech: Speech normal.        Behavior: Behavior is cooperative.      UC Treatments / Results  Labs (all labs ordered are listed, but only abnormal results are displayed) Labs Reviewed - No data to display  EKG   Radiology No results found.  Procedures Procedures (including critical care time)  Medications Ordered in UC Medications  ketorolac (TORADOL) injection 60 mg (60 mg Intramuscular Given 04/04/24 1655)    Initial Impression / Assessment and Plan / UC Course  I have reviewed the triage vital signs and the nursing notes.  Pertinent labs & imaging results that were available during my care of the patient were reviewed by me and considered in my medical decision making (see chart for details).     Patient presents with a 3-day history of left-sided facial and dental pain involving the upper teeth, gums, and cheek, associated with swelling and headache. Pain worsens with chewing and has interfered with sleep. Patient denies fever but reports night sweats. He is a smoker and has been using Goody Powder for symptom relief. Exam reveals facial swelling, poor dentition with dental caries and cracked teeth, and gum inflammation without overt abscess. Blood pressure is elevated, likely due to pain. Clinical presentation is consistent with a dental infection. Patient was treated with an intramuscular injection of anti-inflammatory and pain medication for immediate relief and prescribed Augmentin  for 7 days, Peridex mouth rinse, and naproxen  twice daily for pain, with instructions to avoid additional NSAIDs or aspirin .  Acetaminophen  may be used for breakthrough pain. A stronger pain medication was also prescribed as needed. Supportive care measures reviewed, including soft diet, warm liquids, and avoiding smoking, straws, or rinsing immediately after using Peridex. Patient was provided with local resources for low-cost dental care and instructed to follow up with a dentist for definitive evaluation and treatment. Blood pressure should be rechecked in a few days, and emergency care should be sought for facial swelling that worsens, fever, difficulty breathing, or signs of systemic illness.  Today's evaluation has revealed no signs of a dangerous process. Discussed diagnosis with patient and/or guardian. Patient and/or guardian aware of their diagnosis, possible red flag symptoms to watch out for and need for close follow up. Patient and/or guardian understands verbal and written discharge instructions. Patient and/or guardian comfortable with plan and disposition.  Patient and/or guardian has a clear mental status at this time, good insight into illness (after discussion and teaching) and has clear judgment to make decisions regarding their care  Documentation was completed with the aid of voice recognition software. Transcription may contain typographical errors.   Final Clinical Impressions(s) / UC Diagnoses   Final diagnoses:  Dental infection  Elevated blood pressure reading without diagnosis of hypertension     Discharge Instructions      You are being treated for a dental infection affecting the left side of your face, upper teeth, and gums. You were given an antibiotic (Augmentin ) to take twice daily for 7 days to help clear the infection. You were also prescribed Peridex mouth rinse to use twice daily; do not rinse your mouth with water afterward, and avoid eating, drinking, or smoking for  30 minutes after use. For pain, take naproxen  twice daily as directed--do not take any over-the-counter NSAIDs or  aspirin  with this medication. You may use Tylenol  for additional pain relief if needed. A stronger pain medication was also prescribed for use as needed. You received an injection today to help reduce pain and inflammation.  To manage your symptoms, eat soft foods, sip warm liquids, avoid using straws, and apply a low-setting heating pad to the affected side of your face. Avoid smoking or vaping during recovery, as this can worsen symptoms and delay healing. Follow up with a dentist as soon as possible for further evaluation and definitive treatment. You were given resources for self-pay dental care if needed. Your blood pressure was elevated today, likely due to pain, and should be rechecked in a few days. Seek emergency care if you develop worsening facial swelling, fever, difficulty breathing or swallowing, severe headache, chest pain, or if symptoms do not improve with treatment.      ED Prescriptions     Medication Sig Dispense Auth. Provider   amoxicillin -clavulanate (AUGMENTIN ) 875-125 MG tablet Take 1 tablet by mouth 2 (two) times daily after a meal. 14 tablet Iola Lukes, FNP   chlorhexidine (PERIDEX) 0.12 % solution Use as directed 15 mLs in the mouth or throat 2 (two) times daily at 8 am and 6 pm for 7 days. Brush teeth then swish in mouth for 2 minutes then spit out. Do not rinse mouth after use. Avoid eating, drinking, smoking and vaping for at least 30 minutes after use 210 mL Keshonna Valvo, FNP   naproxen  (NAPROSYN ) 500 MG tablet Take 1 tablet (500 mg total) by mouth 2 (two) times daily with a meal. Take with food to avoid stomach upset. Do not take any additional NSAIDs while on this. You may take tylenol  in addition to this if needed for extra pain relief. 20 tablet Iola Lukes, FNP   oxyCODONE (ROXICODONE) 5 MG immediate release tablet Take 1 tablet (5 mg total) by mouth every 4 (four) hours as needed for severe pain (pain score 7-10). 12 tablet Iola Lukes, FNP       I have reviewed the PDMP during this encounter.   Iola Lukes, [place] 04/04/24 1719

## 2024-04-04 NOTE — Discharge Instructions (Addendum)
 You are being treated for a dental infection affecting the left side of your face, upper teeth, and gums. You were given an antibiotic (Augmentin ) to take twice daily for 7 days to help clear the infection. You were also prescribed Peridex mouth rinse to use twice daily; do not rinse your mouth with water afterward, and avoid eating, drinking, or smoking for 30 minutes after use. For pain, take naproxen  twice daily as directed--do not take any over-the-counter NSAIDs or aspirin  with this medication. You may use Tylenol  for additional pain relief if needed. A stronger pain medication was also prescribed for use as needed. You received an injection today to help reduce pain and inflammation.  To manage your symptoms, eat soft foods, sip warm liquids, avoid using straws, and apply a low-setting heating pad to the affected side of your face. Avoid smoking or vaping during recovery, as this can worsen symptoms and delay healing. Follow up with a dentist as soon as possible for further evaluation and definitive treatment. You were given resources for self-pay dental care if needed. Your blood pressure was elevated today, likely due to pain, and should be rechecked in a few days. Seek emergency care if you develop worsening facial swelling, fever, difficulty breathing or swallowing, severe headache, chest pain, or if symptoms do not improve with treatment.

## 2024-04-04 NOTE — ED Triage Notes (Signed)
 Pt states dental pain on right side for the past 3 days.  Right side of face swollen.  States he has been taking Goody powder at home for the pain with little relief.

## 2024-05-27 ENCOUNTER — Other Ambulatory Visit: Payer: Self-pay

## 2024-05-27 ENCOUNTER — Emergency Department (HOSPITAL_COMMUNITY)
Admission: EM | Admit: 2024-05-27 | Discharge: 2024-05-28 | Disposition: A | Discharge status: Other Acute Inpatient Hospital | Attending: Emergency Medicine | Admitting: Emergency Medicine

## 2024-05-27 DIAGNOSIS — F10920 Alcohol use, unspecified with intoxication, uncomplicated: Secondary | ICD-10-CM

## 2024-05-27 DIAGNOSIS — R45851 Suicidal ideations: Secondary | ICD-10-CM | POA: Insufficient documentation

## 2024-05-27 DIAGNOSIS — F32A Depression, unspecified: Secondary | ICD-10-CM | POA: Diagnosis not present

## 2024-05-27 DIAGNOSIS — F10129 Alcohol abuse with intoxication, unspecified: Secondary | ICD-10-CM | POA: Diagnosis not present

## 2024-05-27 DIAGNOSIS — Y906 Blood alcohol level of 120-199 mg/100 ml: Secondary | ICD-10-CM | POA: Insufficient documentation

## 2024-05-27 LAB — COMPREHENSIVE METABOLIC PANEL WITH GFR
ALT: 19 U/L (ref 0–44)
AST: 23 U/L (ref 15–41)
Albumin: 4.6 g/dL (ref 3.5–5.0)
Alkaline Phosphatase: 82 U/L (ref 38–126)
Anion gap: 14 (ref 5–15)
BUN: 9 mg/dL (ref 6–20)
CO2: 23 mmol/L (ref 22–32)
Calcium: 9.4 mg/dL (ref 8.9–10.3)
Chloride: 105 mmol/L (ref 98–111)
Creatinine, Ser: 0.92 mg/dL (ref 0.61–1.24)
GFR, Estimated: >60 mL/min
Glucose, Bld: 100 mg/dL — ABNORMAL HIGH (ref 70–99)
Potassium: 3.3 mmol/L — ABNORMAL LOW (ref 3.5–5.1)
Sodium: 142 mmol/L (ref 135–145)
Total Bilirubin: 0.5 mg/dL (ref 0.0–1.2)
Total Protein: 6.8 g/dL (ref 6.5–8.1)

## 2024-05-27 LAB — CBC
HCT: 43.5 % (ref 39.0–52.0)
Hemoglobin: 14.5 g/dL (ref 13.0–17.0)
MCH: 30.4 pg (ref 26.0–34.0)
MCHC: 33.3 g/dL (ref 30.0–36.0)
MCV: 91.2 fL (ref 80.0–100.0)
Platelets: 192 K/uL (ref 150–400)
RBC: 4.77 MIL/uL (ref 4.22–5.81)
RDW: 13.2 % (ref 11.5–15.5)
WBC: 6.1 K/uL (ref 4.0–10.5)
nRBC: 0 % (ref 0.0–0.2)

## 2024-05-27 LAB — ETHANOL: Alcohol, Ethyl (B): 161 mg/dL — ABNORMAL HIGH (ref <15)

## 2024-05-27 MED ORDER — LORAZEPAM 1 MG PO TABS
1.0000 mg | ORAL_TABLET | Freq: Once | ORAL | Status: AC
  Administered 2024-05-27: 1 mg via ORAL
  Filled 2024-05-27: qty 1

## 2024-05-27 NOTE — ED Notes (Addendum)
 Pt provided with urine cup and able to ambulate to restroom, but unable to provide a urine sample at this time. Pt encouraged to try again later.

## 2024-05-27 NOTE — ED Notes (Signed)
 Pt has been changed into burgundy scrubs and non-skid socks at this time. Pt has 2 bags of belongings and have been placed in the rooms 9-12 nursing station cabinets with pt stickers on them.

## 2024-05-27 NOTE — BH Assessment (Addendum)
 This patient was referred to [facility] telecare for his teleassessment.  A coordinator with [facility] will reach out with a provider and time to see patient.

## 2024-05-27 NOTE — ED Provider Notes (Signed)
 [place] EM[facility] DEPARTMENT AT [facility] Provider Note   CSN: [id number] Arrival date & time: 05/27/24  1925     Patient presents with: Suicidal   Casey Lawson is a 38 y.o. male.   HPI   Patient has history of ADHD migraines.  Patient presents to the ED for evaluation of increased stress and suicidal ideation.  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  Patient states he normally is an introvert but has been having to deal with a lot of people because of his jobs.  Patient states he has been very stressed recently because another individual created uniforms for his football team but did not provide any uniforms.  The players are now coming to him asking where the money is.  Patient states he also received a phone call from someone earlier this morning who was threatening suicide.  Patient states he is having to deal with all these issues.  He normally does not want to talk to anyone because his father always instructed to to keep his troubles to himself.  He was brought in by [employer].  Friends called the police because the patient apparently voiced suicidal ideation.  According to police patient was brought from the [address] parking deck threatening to jump off.  Prior to Admission medications   Medication Sig Start Date End Date Taking? Authorizing Provider  amoxicillin -clavulanate (AUGMENTIN ) 875-125 MG tablet Take 1 tablet by mouth 2 (two) times daily after a meal. 04/04/24   Iola Lukes, FNP  naproxen  (NAPROSYN ) 500 MG tablet Take 1 tablet (500 mg total) by mouth 2 (two) times daily with a meal. Take with food to avoid stomach upset. Do not take any additional NSAIDs while on this. You may take tylenol  in addition to this if needed for extra pain relief. 04/04/24   Iola Lukes, FNP  oxyCODONE  (ROXICODONE ) 5 MG immediate release tablet Take 1 tablet (5 mg total) by mouth every 4 (four)  hours as needed for severe pain (pain score 7-10). 04/04/24   Iola Lukes, FNP    Allergies: Patient has no known allergies.    Review of Systems  Updated Vital Signs BP (!) 144/98 (BP Location: Right Arm)   Pulse 72   Temp 98.4 F (36.9 C) (Oral)   Resp 16   Ht 1.727 m (5' 8)   Wt 64.9 kg   SpO2 96%   BMI 21.74 kg/m   Physical Exam Vitals and nursing note reviewed.  Constitutional:      General: He is not in acute distress.    Appearance: He is well-developed.  HENT:     Head: Normocephalic and atraumatic.     Right Ear: External ear normal.     Left Ear: External ear normal.  Eyes:     General: No scleral icterus.       Right eye: No discharge.        Left eye: No discharge.     Conjunctiva/sclera: Conjunctivae normal.  Neck:     Trachea: No tracheal deviation.  Cardiovascular:     Rate and Rhythm: Normal rate and regular rhythm.  Pulmonary:     Effort: Pulmonary effort is normal. No respiratory distress.     Breath sounds: Normal breath sounds. No stridor. No wheezing or rales.  Abdominal:     General: Bowel sounds are normal. There is no distension.     Palpations:  Abdomen is soft.     Tenderness: There is no abdominal tenderness. There is no guarding or rebound.  Musculoskeletal:        General: No tenderness or deformity.     Cervical back: Neck supple.  Skin:    General: Skin is warm and dry.     Findings: No rash.  Neurological:     General: No focal deficit present.     Mental Status: He is alert.     Cranial Nerves: No cranial nerve deficit, dysarthria or facial asymmetry.     Sensory: No sensory deficit.     Motor: No abnormal muscle tone or seizure activity.     Coordination: Coordination normal.  Psychiatric:        Mood and Affect: Mood is depressed.        Speech: Speech is tangential.        Behavior: Behavior is not aggressive or hyperactive.        Thought Content: Thought content includes suicidal ideation.     (all labs ordered  are listed, but only abnormal results are displayed) Labs Reviewed  ETHANOL - Abnormal; Notable for the following components:      Result Value   Alcohol, Ethyl (B) 161 (*)    All other components within normal limits  CBC  URINE DRUG SCREEN  COMPREHENSIVE METABOLIC PANEL WITH GFR    EKG: None  Radiology: No results found.   Procedures   Medications Ordered in the ED  LORazepam  (ATIVAN ) tablet 1 mg (1 mg Oral Given 05/27/24 2128)    Clinical Course as of 05/27/24 2310  Tue May 27, 2024  2309 CBC normal.  Alcohol elevated 161 [JK]    Clinical Course User Index [JK] Randol Simmonds, MD                                 Medical Decision Making Problems Addressed: Alcoholic intoxication without complication: acute illness or injury that poses a threat to life or bodily functions Suicidal ideation: acute illness or injury that poses a threat to life or bodily functions  Amount and/or Complexity of Data Reviewed Labs: ordered. Decision-making details documented in ED Course.  Risk Prescription drug management.   Patient presented to the ED for suicidal ideation.  Patient reports increasing stressors.  Patient's metabolic panel still currently pending but otherwise appears medically cleared.  Psychiatry consulted for further evaluation.  Dispo pending psych evaluation. Care turned over at shift change     Final diagnoses:  Suicidal ideation  Alcoholic intoxication without complication    ED Discharge Orders     None          Randol Simmonds, MD 05/27/24 2313

## 2024-05-27 NOTE — ED Triage Notes (Signed)
 Pt brought BIB [employer] following SI. [employer] officer reports call out by friends d/t concern for pt voice SI. Reports he was brought from the [address] parking deck threatening to jump off.

## 2024-05-28 ENCOUNTER — Encounter (HOSPITAL_COMMUNITY): Payer: Self-pay | Admitting: Psychiatry

## 2024-05-28 ENCOUNTER — Encounter (HOSPITAL_COMMUNITY): Payer: Self-pay | Admitting: Psychiatric/Mental Health

## 2024-05-28 ENCOUNTER — Inpatient Hospital Stay (HOSPITAL_COMMUNITY)
Admission: AD | Admit: 2024-05-28 | Discharge: 2024-05-30 | DRG: 881 | Disposition: A | Discharge status: Home / Self Care | Source: Intra-hospital | Attending: Student in an Organized Health Care Education/Training Program | Admitting: Student in an Organized Health Care Education/Training Program

## 2024-05-28 DIAGNOSIS — Y906 Blood alcohol level of 120-199 mg/100 ml: Secondary | ICD-10-CM | POA: Diagnosis present

## 2024-05-28 DIAGNOSIS — F1290 Cannabis use, unspecified, uncomplicated: Secondary | ICD-10-CM | POA: Diagnosis present

## 2024-05-28 DIAGNOSIS — R45851 Suicidal ideations: Secondary | ICD-10-CM | POA: Diagnosis present

## 2024-05-28 DIAGNOSIS — F909 Attention-deficit hyperactivity disorder, unspecified type: Secondary | ICD-10-CM | POA: Diagnosis present

## 2024-05-28 DIAGNOSIS — F329 Major depressive disorder, single episode, unspecified: Principal | ICD-10-CM | POA: Diagnosis present

## 2024-05-28 DIAGNOSIS — F10120 Alcohol abuse with intoxication, uncomplicated: Secondary | ICD-10-CM | POA: Diagnosis present

## 2024-05-28 DIAGNOSIS — F10920 Alcohol use, unspecified with intoxication, uncomplicated: Secondary | ICD-10-CM | POA: Diagnosis present

## 2024-05-28 DIAGNOSIS — F32A Depression, unspecified: Secondary | ICD-10-CM

## 2024-05-28 DIAGNOSIS — F69 Unspecified disorder of adult personality and behavior: Principal | ICD-10-CM

## 2024-05-28 DIAGNOSIS — F10129 Alcohol abuse with intoxication, unspecified: Secondary | ICD-10-CM

## 2024-05-28 LAB — URINE DRUG SCREEN
Amphetamines: NEGATIVE
Barbiturates: NEGATIVE
Benzodiazepines: NEGATIVE
Cocaine: NEGATIVE
Fentanyl: NEGATIVE
Methadone Scn, Ur: NEGATIVE
Opiates: NEGATIVE
Tetrahydrocannabinol: POSITIVE — AB

## 2024-05-28 MED ORDER — ALUM & MAG HYDROXIDE-SIMETH 200-200-20 MG/5ML PO SUSP: 30.0000 mL | ORAL | Status: DC | PRN

## 2024-05-28 MED ORDER — DIPHENHYDRAMINE HCL 25 MG PO CAPS: 50.0000 mg | ORAL_CAPSULE | Freq: Three times a day (TID) | ORAL | Status: DC | PRN

## 2024-05-28 MED ORDER — LORAZEPAM 1 MG PO TABS: 1.0000 mg | ORAL_TABLET | ORAL | Status: DC | PRN

## 2024-05-28 MED ORDER — HALOPERIDOL LACTATE 5 MG/ML IJ SOLN: 5.0000 mg | Freq: Three times a day (TID) | INTRAMUSCULAR | Status: DC | PRN

## 2024-05-28 MED ORDER — HYDROXYZINE HCL 25 MG PO TABS: 25.0000 mg | ORAL_TABLET | Freq: Three times a day (TID) | ORAL | Status: DC | PRN

## 2024-05-28 MED ORDER — LORAZEPAM 2 MG/ML IJ SOLN: 1.0000 mg | INTRAMUSCULAR | Status: DC | PRN

## 2024-05-28 MED ORDER — DIPHENHYDRAMINE HCL 50 MG/ML IJ SOLN: 50.0000 mg | Freq: Three times a day (TID) | INTRAMUSCULAR | Status: DC | PRN

## 2024-05-28 MED ORDER — MAGNESIUM HYDROXIDE 400 MG/5ML PO SUSP: 30.0000 mL | Freq: Every day | ORAL | Status: DC | PRN

## 2024-05-28 MED ORDER — VITAMIN B-1 100 MG PO TABS
100.0000 mg | ORAL_TABLET | Freq: Every day | ORAL | Status: DC
  Administered 2024-05-28: 100 mg via ORAL
  Filled 2024-05-28 (×3): qty 1

## 2024-05-28 MED ORDER — HALOPERIDOL LACTATE 5 MG/ML IJ SOLN: 10.0000 mg | Freq: Three times a day (TID) | INTRAMUSCULAR | Status: DC | PRN

## 2024-05-28 MED ORDER — HALOPERIDOL 5 MG PO TABS: 5.0000 mg | ORAL_TABLET | Freq: Three times a day (TID) | ORAL | Status: DC | PRN

## 2024-05-28 MED ORDER — CLONIDINE HCL 0.1 MG PO TABS
0.1000 mg | ORAL_TABLET | Freq: Three times a day (TID) | ORAL | Status: DC | PRN
  Administered 2024-05-28 – 2024-05-29 (×2): 0.1 mg via ORAL
  Filled 2024-05-28 (×2): qty 1

## 2024-05-28 MED ORDER — OLANZAPINE 10 MG IM SOLR: 10.0000 mg | Freq: Once | INTRAMUSCULAR | Status: DC | PRN

## 2024-05-28 MED ORDER — LORAZEPAM 2 MG/ML IJ SOLN: 2.0000 mg | Freq: Three times a day (TID) | INTRAMUSCULAR | Status: DC | PRN

## 2024-05-28 MED ORDER — ACETAMINOPHEN 325 MG PO TABS: 650.0000 mg | ORAL_TABLET | Freq: Four times a day (QID) | ORAL | Status: DC | PRN

## 2024-05-28 MED ORDER — FOLIC ACID 1 MG PO TABS
1.0000 mg | ORAL_TABLET | Freq: Every day | ORAL | Status: DC
  Administered 2024-05-28: 1 mg via ORAL
  Filled 2024-05-28 (×3): qty 1

## 2024-05-28 MED ORDER — ADULT MULTIVITAMIN W/MINERALS CH
1.0000 | ORAL_TABLET | Freq: Every day | ORAL | Status: DC
  Administered 2024-05-28: 1 via ORAL
  Filled 2024-05-28 (×3): qty 1

## 2024-05-28 MED ORDER — THIAMINE HCL 100 MG/ML IJ SOLN
100.0000 mg | Freq: Every day | INTRAMUSCULAR | Status: DC
Start: 2024-05-28 — End: 2024-05-28

## 2024-05-28 NOTE — Progress Notes (Signed)
 Pt has been accepted to [facility] on 05/28/2024 Bed assignment: 405-01  Pt meets inpatient criteria per: Katlin Hunt NP  Attending Physician will be: Dr. Prentis    Report can be called to:Adult unit: [phone]  Pt can arrive after ASAP  Care Team Notified: [facility] Bretta Qua RN, Alan Kerns RN, [name] [facility]  [place] Jeni Duling LCSW-A   05/28/2024 12:17 PM

## 2024-05-28 NOTE — Progress Notes (Signed)
 Pt admitted to [facility] under voluntary status from [facility] for SI attempt. Per chart review and nursing report; pt's friends called [employer] reporting that pt was suicidal. [employer] took him from [employer] parking deck where pt was threatening to jump off the ledge. Pt presented A & O to self, place and situation. Noted with   pressured speech, fair eye contact, restless / fidgety and defensive on interactions. Per pt I admit I got drunk because I drank Tequila last night. I went downtown, sat on the ledge at the parking deck. It's high and you can see the whole city. It's peaceful up there; I was not trying to kill myself. I got a job interview today at 4:30 pm today, I don't need to be here. Observed to be minimizing current situation but is cooperative with care. Vitals done, BP elevated 166/133-154/110. Denies SI, HI, AVH and pain when assessed. Denies all forms of abuse. Admits to smoking marijuana daily It helps me calm down. I got ADHD. I drink Smirnoff when I'm watching game. I drank Tequila for the first time yesterday and it's too strong for me. 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 Lives with his younger sister. Skin assessment done, abrasion noted to inner aspect of left arm that was when I was climbing down from the ledge yesterday. I'm fine. Pt's belongings searched and items deemed contraband secured in assigned locker. Pt ambulatory to unit with a slow but steady gait. Unit orientation done, routines discussed, care plan reviewed and admission documents signed. Emotional support, encouragement and reassurance offered. Safety checks initiated at Q 15 minutes intervals without incident.

## 2024-05-28 NOTE — ED Notes (Signed)
 Pt calmly pacing in front of nurses station w/no complaints. He provided some contact numbers for family and same were forwarded to psych team. No needs identified at this time.

## 2024-05-28 NOTE — BH Assessment (Signed)
 Dr. Drury attempted to assess pt however pt was too somnolent to engage.   It was encouraged to inform the chat once pt is able to engage.   Casey JONETTA Broach, MS, [facility], [facility] Triage Specialist [phone]

## 2024-05-28 NOTE — Plan of Care (Signed)
  Problem: Safety: Goal: Periods of time without injury will increase Outcome: Progressing   Problem: Activity: Goal: Interest or engagement in activities will improve Outcome: Not Progressing

## 2024-05-28 NOTE — ED Notes (Signed)
 [facility] called pts sister Casey Lawson for collateral information. Per pts sister Casey Lawson has been experiencing stress which has been building due to life events with relationships, employment, and other issues. Per pts sister Casey Lawson does not have any known inpatient or outpatient psychiatric treatment or mental health history. Pts sister reports that she has been only recently gotten back in touch with family and is still learning more about the family and it's dynamic. Pts sister believes that their family has some issues with communication and differing opinions about religious beliefs.  Casey Lawson lives with his sister sometimes and mostly with his grandmother.   [facility] received a message to call pts mother, Casey Lawson. [facility] called Casey Lawson mother in response to her message. Pts mother initially said Casey Lawson does not need to go inpatient but after [facility] explained that Casey Lawson was picked up by police after his friends called to report concerns over comments that he was expressing live online and being on a parking deck, pts mother said that if Casey Lawson agrees to go inpatient then she is fine with him being admitted.  [name], [facility]  05/28/24

## 2024-05-28 NOTE — BH Assessment (Addendum)
 [facility] checked in (via secure message) with ED staff if the pt was awake. Warrick, RN replied, "nope." [facility] will check in once more before deferring to AM provider.    Jackson JONETTA Broach, MS, [facility], [facility] Triage Specialist [phone]

## 2024-05-28 NOTE — ED Notes (Signed)
[facility] called 

## 2024-05-28 NOTE — ED Notes (Addendum)
 Pt noted to be talking on a cell phone. Pt was noted to be speaking with a gentleman whom he identified as his brother and he stated it was his phone. Security was called and it was resolved.

## 2024-05-28 NOTE — Group Note (Signed)
 Date:  05/28/2024 Time:  4:38 PM  Group Topic/Focus:  Dimensions of Wellness:   The focus of this group is to introduce the topic of wellness and discuss the role each dimension of wellness plays in total health.    Participation Level:  Did Not Attend  Shanda JONETTA Challenger 05/28/2024, 4:38 PM

## 2024-05-28 NOTE — BH Assessment (Signed)
 At 0620, Warrick, RN reports pt was awake. Per IRIS Pt to be assessed at 0815. Cart number confirmed.    Jackson JONETTA Broach, MS, [facility], [facility] Triage Specialist [phone]

## 2024-05-28 NOTE — Tx Team (Signed)
 Initial Treatment Plan 05/28/2024 5:21 PM [name] FMW:[id number]    PATIENT STRESSORS: Occupational concerns   Substance abuse     PATIENT STRENGTHS: Capable of independent living  Personnel officer means  Work skills    PATIENT IDENTIFIED PROBLEMS: Ineffective coping    Polysubstance Abuse I smoke weed everyday and drink smirnoff too.    Risk for self harm I just went to sit on the ledge downtown, drunk and it was peaceful.             DISCHARGE CRITERIA:  Improved stabilization in mood, thinking, and/or behavior Verbal commitment to aftercare and medication compliance Withdrawal symptoms are absent or subacute and managed without 24-hour nursing intervention  PRELIMINARY DISCHARGE PLAN: Outpatient therapy Return to previous living arrangement Return to previous work or school arrangements  PATIENT/FAMILY INVOLVEMENT: This treatment plan has been presented to and reviewed with the patient, Casey Lawson. The patient have been given the opportunity to ask questions and make suggestions.  Jocelin Schuelke, RN 05/28/2024, 5:21 PM

## 2024-05-28 NOTE — Consult Note (Addendum)
 [facility] Consult Note  Patient Name: Casey Lawson MRN: [id number] DOB: 02-15-1986 DATE OF Consult: 05/28/2024  PRIMARY PSYCHIATRIC DIAGNOSES  1.  Depressive Disorder, Unspecified  2.  Suicidal Ideations  3. Alcohol Intoxication, uncomplicated     RECOMMENDATIONS  Recommendations: Medication recommendations:  -- Hydroxyzine  25mg  po TID PRN for anxiety -- Olanzapine  10mg  IM Q8H PRN for acute agitation   Non-Medication/therapeutic recommendations:  -- Patient is currently voluntary in the ED -- Concerned about social media posts and live feed where he is verbalizing suicide and desire to die.  -- Patient is requesting discharge  -- I attempted to call sister for collateral but no answer.  -- Recommend primary team work on obtaining collateral information to determine safety concerns/ disposition.  -- However at this time, patient meets criteria for IVC and inpatient psychiatric admission.  Is inpatient psychiatric hospitalization recommended for this patient? -- Yes, patient meets criteria for IVC and inpatient psychiatric admission   From a psychiatric perspective, is this patient appropriate for discharge to an outpatient setting/resource or other less restrictive environment for continued care?  No, not without collateral information. If collateral is obtained and safety planning can be initiated with SW would recommend having psychiatry follow-up to determine risk.   Follow-Up Telepsychiatry C/L services: We will sign off for now. Please re-consult our service if needed for any concerning changes in the patient's condition, discharge planning, or questions.  Communication: Treatment team members (and family members if applicable) who were involved in treatment/care discussions and planning, and with whom we spoke or engaged with via secure text/chat, include the following: ED Team via [employer]  Thank you for involving us  in the care of this patient. If you have any  additional questions or concerns, please call [phone] and ask for me or the provider on-call.  TELEPSYCHIATRY ATTESTATION & CONSENT  As the provider for this telehealth consult, I attest that I verified the patient's identity using two separate identifiers, introduced myself to the patient, provided my credentials, disclosed my location, and performed this encounter via a HIPAA-compliant, real-time, face-to-face, two-way, interactive audio and video platform and with the full consent and agreement of the patient (or guardian as applicable.)  Patient physical location: ED in [facility]. Telehealth provider physical location: home office in state of [place] .  Video start time: 0716 (Central Time) Video end time: 0736 (Central Time)   IDENTIFYING DATA  Casey Lawson is a 38 y.o. year-old male for whom a psychiatric consultation has been ordered by the primary provider. The patient was identified using two separate identifiers.  CHIEF COMPLAINT/REASON FOR CONSULT  Suicidal Ideations   HISTORY OF PRESENT ILLNESS (HPI)  The patient is a 38yo male who presented to the emergency department via police. Police were called to [employer] parking deck where patient was threatening to jump off. A friend called police due to concerns that patient had voiced suicidal ideations.   BAL 161 UDS + cannabis  Patient states he has been struggling with multiple stressors that have been building up over the last few months. 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 He has recently created a sorority/ fraternity called [address] and struggling with issues within the organization. 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 Him and sister recently purchased a house and now having to deal with mortgage and having financial stress. Recently found out he has a  4yo daughter that he didn't know about. 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   Patient states last night he was having an argument with his sister, felt he was not heard. Decided to leave the house and walk and get a drink. Patient states he only drank a little bit and went live on Facebook sharing how he felt. States he was venting about everything and people got concerned. Admits he was on top of the parking garage downtown when he went live. Police arrived. Patient states he had been drinking and was deep into my thoughts. States he only wanted people to listen; denies actual intent to kill himself. Denies passive and active suicidal and homicidal ideations.   Patient denies prior psychiatric history. States he has been able to keep his problems to him self for his lifetime until now. States his father always taught him to not show his emotions or issues to anyone. He is requesting discharge at this time. Denies SI/HI. Denies access to weapons/ firearms at home.      PAST PSYCHIATRIC HISTORY  Denies prior psychiatric history.   Otherwise as per HPI above.  PAST MEDICAL HISTORY  Past Medical History:  Diagnosis Date   ADHD (attention deficit hyperactivity disorder)    Closed left ankle fracture 03/24/2022   Migraine    Skin abnormality 03/24/2022   left ankle scraped area healing with scab, using mupirocin  ointment to   Uses crutches      HOME MEDICATIONS  Facility Ordered Medications  Medication   [COMPLETED] LORazepam  (ATIVAN ) tablet 1 mg   PTA Medications  Medication Sig   amoxicillin -clavulanate (AUGMENTIN ) 875-125 MG tablet Take 1 tablet by mouth 2 (two) times daily after a meal.   naproxen  (NAPROSYN ) 500 MG tablet Take 1 tablet (500 mg total) by mouth 2 (two) times daily with a meal. Take with food to avoid stomach upset. Do not take any additional NSAIDs while on this. You may take tylenol  in addition to this if needed for extra pain  relief.   oxyCODONE  (ROXICODONE ) 5 MG immediate release tablet Take 1 tablet (5 mg total) by mouth every 4 (four) hours as needed for severe pain (pain score 7-10).     ALLERGIES  No Known Allergies  SOCIAL & SUBSTANCE USE HISTORY  Social History   Socioeconomic History   Marital status: Single    Spouse name: Not on file   Number of children: Not on file   Years of education: Not on file   Highest education level: Not on file  Occupational History   Not on file  Tobacco Use   Smoking status: Never   Smokeless tobacco: Never   Tobacco comments:    hookah  Vaping Use   Vaping status: Never Used  Substance and Sexual Activity   Alcohol use: Yes    Comment: occ   Drug use: Yes    Frequency: 7.0 times per week    Types: Marijuana    Comment: daily for adhd   Sexual activity: Yes    Birth control/protection: None  Other Topics Concern   Not on file  Social History Narrative   Not on file   Social Drivers of Health   Financial Resource Strain: Not on file  Food Insecurity: Not on file  Transportation Needs: Not on file  Physical Activity: Not on file  Stress: Not on file  Social Connections: Not on file  Social History   Tobacco Use  Smoking Status Never  Smokeless Tobacco Never  Tobacco Comments   hookah   Social History   Substance and Sexual Activity  Alcohol Use Yes   Comment: occ   Social History   Substance and Sexual Activity  Drug Use Yes   Frequency: 7.0 times per week   Types: Marijuana   Comment: daily for adhd    Additional pertinent information: currently lives with sister. Employed via [employer].    FAMILY HISTORY  Family History  Problem Relation Age of Onset   Healthy Mother    Healthy Father    Family Psychiatric History (if known):  Denies   MENTAL STATUS EXAM (MSE)  Mental Status Exam: General Appearance: Fairly Groomed  Orientation:  Full (Time, Place, and Person)  Memory:  Immediate;   Good Recent;   Good   Concentration:  Concentration: Good  Recall:  Good  Attention  Good  Eye Contact:  Good  Speech:  Clear and Coherent  Language:  Good  Volume:  Normal  Mood: Euthymic   Affect:  Appropriate  Thought Process:  Coherent  Thought Content:  Logical  Suicidal Thoughts:  No  Homicidal Thoughts:  No  Judgement:  Poor  Insight:  Lacking  Psychomotor Activity:  Normal  Akathisia:  No  Fund of Knowledge:  Good    Assets:  Communication Skills Desire for Improvement Housing Leisure Time Physical Health Social Support Talents/Skills  Cognition:  WNL  ADL's:  Intact  AIMS (if indicated):       VITALS  Blood pressure (!) 152/104, pulse 60, temperature 98 F (36.7 C), temperature source Oral, resp. rate 15, height 5' 8 (1.727 m), weight 64.9 kg, SpO2 98%.  LABS  Admission on 05/27/2024  Component Date Value Ref Range Status   Alcohol, Ethyl (B) 05/27/2024 161 (H)  <15 mg/dL Final   Comment: (NOTE) For medical purposes only. Performed at [facility], [address] [place], [place] [address]    WBC 05/27/2024 6.1  4.0 - 10.5 K/uL Final   RBC 05/27/2024 4.77  4.22 - 5.81 MIL/uL Final   Hemoglobin 05/27/2024 14.5  13.0 - 17.0 g/dL Final   HCT 90/76/7974 43.5  39.0 - 52.0 % Final   MCV 05/27/2024 91.2  80.0 - 100.0 fL Final   MCH 05/27/2024 30.4  26.0 - 34.0 pg Final   MCHC 05/27/2024 33.3  30.0 - 36.0 g/dL Final   RDW 90/76/7974 13.2  11.5 - 15.5 % Final   Platelets 05/27/2024 192  150 - 400 K/uL Final   nRBC 05/27/2024 0.0  0.0 - 0.2 % Final   Performed at [facility], [address], [place], [place] [address]   Opiates 05/28/2024 NEGATIVE  NEGATIVE Final   Cocaine 05/28/2024 NEGATIVE  NEGATIVE Final   Benzodiazepines 05/28/2024 NEGATIVE  NEGATIVE Final   Amphetamines 05/28/2024 NEGATIVE  NEGATIVE Final   Tetrahydrocannabinol 05/28/2024 POSITIVE (A)  NEGATIVE Final   Barbiturates 05/28/2024 NEGATIVE  NEGATIVE Final   Methadone  Scn, Ur 05/28/2024 NEGATIVE  NEGATIVE Final   Fentanyl  05/28/2024 NEGATIVE  NEGATIVE Final   Comment: (NOTE) Drug screen is for Medical Purposes only. Positive results are preliminary only. If confirmation is needed, notify lab within 5 days.  Drug Class                 Cutoff (ng/mL) Amphetamine and metabolites 1000 Barbiturate and metabolites 200 Benzodiazepine  200 Opiates and metabolites     300 Cocaine and metabolites     300 THC                         50 Fentanyl                     5 Methadone                   300  Trazodone is metabolized in vivo to several metabolites,  including pharmacologically active m-CPP, which is excreted in the  urine.  Immunoassay screens for amphetamines and MDMA have potential  cross-reactivity with these compounds and may provide false positive  result.  Performed at [facility], [address] [place], [place] [address]    Sodium 05/27/2024 142  135 - 145 mmol/L Final   Potassium 05/27/2024 3.3 (L)  3.5 - 5.1 mmol/L Final   Chloride 05/27/2024 105  98 - 111 mmol/L Final   CO2 05/27/2024 23  22 - 32 mmol/L Final   Glucose, Bld 05/27/2024 100 (H)  70 - 99 mg/dL Final   Glucose reference range applies only to samples taken after fasting for at least 8 hours.   BUN 05/27/2024 9  6 - 20 mg/dL Final   Creatinine, Ser 05/27/2024 0.92  0.61 - 1.24 mg/dL Final   Calcium 90/76/7974 9.4  8.9 - 10.3 mg/dL Final   Total Protein 90/76/7974 6.8  6.5 - 8.1 g/dL Final   Albumin 90/76/7974 4.6  3.5 - 5.0 g/dL Final   AST 90/76/7974 23  15 - 41 U/L Final   ALT 05/27/2024 19  0 - 44 U/L Final   Alkaline Phosphatase 05/27/2024 82  38 - 126 U/L Final   Total Bilirubin 05/27/2024 0.5  0.0 - 1.2 mg/dL Final   GFR, Estimated 05/27/2024 >60  >60 mL/min Final   Comment: (NOTE) Calculated using the CKD-EPI Creatinine Equation (2021)    Anion gap 05/27/2024 14  5 - 15 Final   Performed at [facility], [address], [place], [place] [address]    PSYCHIATRIC REVIEW OF SYSTEMS (ROS)  ROS: Notable for the following relevant positive findings: Review of Systems  Psychiatric/Behavioral:  Positive for depression and substance abuse. Negative for hallucinations and suicidal ideas. The patient is nervous/anxious.     Additional findings:      Musculoskeletal: No abnormal movements observed      Gait & Station: Laying/Sitting      Pain Screening: Denies      Nutrition & Dental Concerns: No concerns at this time   RISK FORMULATION/ASSESSMENT  Is the patient experiencing any suicidal or homicidal ideations: Yes       Explain if yes: Patient denies current SI/HI. However, last night was on top of parking garage, threatening jumping, went on Facebook live.   Protective factors considered for safety management: access to care  Risk factors/concerns considered for safety management:  Depression Impulsivity Male gender Unmarried  Is there a safety management plan with the patient and treatment team to minimize risk factors and promote protective factors: Yes           Explain: currently in the ED  Is crisis care placement or psychiatric hospitalization recommended: Yes     Based on my current evaluation and risk assessment, patient is determined at this time to be at:  High risk  *RISK ASSESSMENT Risk assessment is a dynamic  process; it is possible that this patient's condition, and risk level, may change. This should be re-evaluated and managed over time as appropriate. Please re-consult psychiatric consult services if additional assistance is needed in terms of risk assessment and management. If your team decides to discharge this patient, please advise the patient how to best access emergency psychiatric services, or to call 911, if their condition worsens or they feel unsafe in any way.   Tyler Robidoux E Marshall Kampf, NP Telepsychiatry Consult Services

## 2024-05-28 NOTE — Progress Notes (Signed)
 Pt's BP remains elevated. Provider made aware. New order received for CIWA with Clonidine  protocol. Meds given as ordered, BP rechecked at 1718 125/88. Pt encouraged to increase fluids, offered, tolerated well as his HR remains 50-54.  Oncoming shift made aware during nursing report. Pt also signed a 72 hour request for d/c at 1509. Providers notified via secure chart this evening.

## 2024-05-28 NOTE — ED Notes (Signed)
 Voluntary consent signed by pt and faxed to [facility]

## 2024-05-28 NOTE — [facility] Group Notes (Signed)
 Adult Psychoeducational Group Note  Date:  05/28/2024 Time:  9:02 PM  Group Topic/Focus:  Wrap-Up Group:   The focus of this group is to help patients review their daily goal of treatment and discuss progress on daily workbooks.  Participation Level:  Active  Participation Quality:  Appropriate  Affect:  Appropriate  Cognitive:  Appropriate  Insight: Appropriate  Engagement in Group:  Engaged  Modes of Intervention:  Discussion  Additional Comments:  Milus Marin said his day was a 1. He is not supposed to be here He need to leave to to home to his son Coping skills sleep helpful. Favoritepart of the day visitation  Lang Donia Law 05/28/2024, 9:02 PM

## 2024-05-29 DIAGNOSIS — F69 Unspecified disorder of adult personality and behavior: Principal | ICD-10-CM

## 2024-05-29 NOTE — Group Note (Signed)
 Occupational Therapy Group Note  Group Topic: Sleep Hygiene  Group Date: 05/29/2024 Start Time: 1500 End Time: 1530 Facilitators: Dot Dallas MATSU, OT   Group Description: Group encouraged increased participation and engagement through topic focused on sleep hygiene. Patients reflected on the quality of sleep they typically receive and identified areas that need improvement. Group was given background information on sleep and sleep hygiene, including common sleep disorders. Group members also received information on how to improve one's sleep and introduced a sleep diary as a tool that can be utilized to track sleep quality over a length of time. Group session ended with patients identifying one or more strategies they could utilize or implement into their sleep routine in order to improve overall sleep quality.        Therapeutic Goal(s):  Identify one or more strategies to improve overall sleep hygiene  Identify one or more areas of sleep that are negatively impacted (sleep too much, too little, etc)     Participation Level: Engaged   Participation Quality: Independent   Behavior: Appropriate   Speech/Thought Process: Relevant   Affect/Mood: Appropriate   Insight: Fair   Judgement: Fair      Modes of Intervention: Education  Patient Response to Interventions:  Attentive   Plan: Continue to engage patient in OT groups 2 - 3x/week.  05/29/2024  D[place] MATSU Dot, OT   Casey Lawson, OT

## 2024-05-29 NOTE — Plan of Care (Signed)
   Problem: Education: Goal: Emotional status will improve Outcome: Progressing Goal: Mental status will improve Outcome: Progressing   Problem: Activity: Goal: Interest or engagement in activities will improve Outcome: Progressing

## 2024-05-29 NOTE — [facility] Suicide Risk Assessment (Signed)
 [facility] INPATIENT:  Family/Significant Other Suicide Prevention Education  Suicide Prevention Education:  Contact Attempts: [place], [place] [phone], (name of family member/significant other) has been identified by the patient as the family member/significant other with whom the patient will be residing, and identified as the person(s) who will aid the patient in the event of a mental health crisis.  With written consent from the patient, two attempts were made to provide suicide prevention education, prior to and/or following the patient's discharge.  We were unsuccessful in providing suicide prevention education.  A suicide education pamphlet was given to the patient to share with family/significant other.  Date and time of first attempt: 05/29/24 @ 1255pm, LVM Date and time of second attempt: 05/29/24 @ 253pm  Jenkins LULLA Primer 05/29/2024, 2:54 PM

## 2024-05-29 NOTE — Group Note (Signed)
 Recreation Therapy Group Note   Group Topic:Other  Group Date: 05/29/2024 Start Time: 1307 End Time: 1346 Facilitators: Gitel Beste-McCall, LRT,CTRS Location: [address] Dayroom   Activity Description/Intervention: Therapeutic Drumming. Patients with peers and staff were given the opportunity to engage in a leader facilitated [employer] Group Empowerment Drumming Circle with staff from the [employer], in partnership with [employer]. Teaching laboratory technician and trained [employer], Norleen Mon leading with LRT observing and documenting intervention and pt response. This evidenced-based practice targets 7 areas of health and wellbeing in the human experience including: stress-reduction, exercise, self-expression, camaraderie/support, nurturing, spirituality, and music-making (leisure).   Goal Area(s) Addresses:  Patient will engage in pro-social way in music group.  Patient will follow directions of drum leader on the first prompt. Patient will demonstrate no behavioral issues during group.  Patient will identify if a reduction in stress level occurs as a result of participation in therapeutic drum circle.    Education: Leisure exposure, Pharmacologist, Musical expression, Discharge Planning   Affect/Mood: Flat   Participation Level: Moderate   Participation Quality: Independent   Behavior: Cooperative   Speech/Thought Process: Relevant   Insight: Good   Judgement: Good   Modes of Intervention: Teaching laboratory technician   Patient Response to Interventions:  Receptive   Education Outcome:  In group clarification offered    Clinical Observations/Individualized Feedback: Pt was laid back and showed little interest in the activity but participated regardless. Pt was receptive and respectful of volunteer and peers. Pt left early and didn't return.      Plan: Continue to engage patient in RT group sessions 2-3x/week.   Petronella Shuford-McCall, LRT,CTRS   05/29/2024 2:34 PM

## 2024-05-29 NOTE — H&P (Signed)
 Psychiatric Admission Assessment Adult  Patient Identification: Casey Lawson MRN:  [id number] Date of Evaluation:  05/29/2024 Chief Complaint:  MDD (major depressive disorder) [F32.9] Principal Diagnosis: Behavior concern in adult Diagnosis:  Principal Problem:   Behavior concern in adult Active Problems:   Alcoholic intoxication without complication    History of Present Illness: The patient is a 38 year old male with no past psychiatric history (per chart review and per patient report).  He was brought from the [address] parking garage to the emergency department on 9/23 by the [facility] Department with reported suicidal thoughts.  He was brought on a voluntary basis, and the patient consistently denied suicidal thoughts or suicidal intentions while in the emergency department and said the police were called due to a misunderstanding with a friend he was talking to.  He was admitted to the behavioral health hospital for further treatment on a voluntary basis.  He signed a 72-hour form for release at 3 PM on 9/24.  On interview today, the patient demonstrates a linear and logical thought process.  He does not appear depressed.  He does appear somewhat excitable and there was some concern for grandiose thinking with the patient reporting he has numerous jobs and multiple girlfriends.  He presents a coherent account of why he was on the parking garage: It is really nice up there and I can see the whole city.  He does report drinking tequila, saying that he does not normally drink that kind of alcohol, and it made me all mixed up with my feelings.  BAL was 161.  He says that he made statements to his friends about his stress and struggles and that 1 friend misinterpreted him, thinking that he was actually contemplating suicide.  He says that this friend then called the police.  Police did not IVC the patient.  Psychiatric evaluation in the ED recommended voluntary admission to  [facility].  Patient reports no prior psychiatric history.  Denies previous suicide attempts or self-harm behavior.  Denies access to firearms.  Says that he lives with his sister, and they are paying the mortgage on a new house.  Patient is frustrated with being here.  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  He wants discharge as soon as possible.  Collateral contact with patient's mother: Darryle Sharps, [phone].  She corroborates the patient's report as described above.  Says that the patient has not had any mental health issues previously.  She says that the patient does not consume alcohol on any regular basis.  She says I think he was just drunk and people did not understand what was going on.  He has been under a lot of stress recently with his jobs.  She denies him having any prior psych history.  Nursing reports say that the patient is doing well on the unit and acting appropriately.  Discussed with attending psychiatrist. Plan for D/C tomorrow morning if he participates well. Patient agreeable to this plan.      Total Time spent with patient: 45 min  Past Psychiatric History: as above  Is the patient at risk to self? yes Has the patient been a risk to self in the past 6 months? yes Has the patient been a risk to self within the distant past? no Is the patient a risk to others? no Has the patient been a risk to others in the past 6 months? no Has the patient been a risk to others within  the distant past? no  [place] Scale:  Flowsheet Row [facility] ([facility]) [facility]27/2024 in [facility] at [facility] from 04/04/2024 in [facility] at [facility] RISK CATEGORY Moderate Risk High Risk No Risk     Prior Inpatient Therapy: yes Prior Outpatient Therapy: yes  Alcohol Screening:  1. How often do you have a drink containing alcohol?: 4 or  more times a week 2. How many drinks containing alcohol do you have on a typical day when you are drinking?: 3 or 4 3. How often do you have six or more drinks on one occasion?: Less than monthly AUDIT-C Score: 6 4. How often during the last year have you found that you were not able to stop drinking once you had started?: Never 5. How often during the last year have you failed to do what was normally expected from you because of drinking?: Never 6. How often during the last year have you needed a first drink in the morning to get yourself going after a heavy drinking session?: Never 7. How often during the last year have you had a feeling of guilt of remorse after drinking?: Never 8. How often during the last year have you been unable to remember what happened the night before because you had been drinking?: Never 9. Have you or someone else been injured as a result of your drinking?: No 10. Has a relative or friend or a doctor or another health worker been concerned about your drinking or suggested you cut down?: No Alcohol Use Disorder Identification Test Final Score (AUDIT): 6 Alcohol Brief Interventions/Follow-up: Alcohol education/Brief advice Substance Abuse History in the last 12 months:  yes Consequences of Substance Abuse: negative Previous Psychotropic Medications: yes Psychological Evaluations: yes Past Medical History:  Past Medical History:  Diagnosis Date   ADHD (attention deficit hyperactivity disorder)    Closed left ankle fracture 03/24/2022   Migraine    Skin abnormality 03/24/2022   left ankle scraped area healing with scab, using mupirocin  ointment to   Uses crutches     Past Surgical History:  Procedure Laterality Date   arm surgery Left 2010   ORIF ANKLE FRACTURE Left 04/04/2022   Procedure: OPEN REDUCTION INTERNAL FIXATION (ORIF) ANKLE FRACTURE;  Surgeon: Beverley Evalene BIRCH, MD;  Location: [facility];  Service: Orthopedics;  Laterality: Left;    Family History:  Family History  Problem Relation Age of Onset   Healthy Mother    Healthy Father    Family Psychiatric  History: none pertinent Tobacco Screening:  Social History   Tobacco Use  Smoking Status Never  Smokeless Tobacco Never  Tobacco Comments   hookah    BH Tobacco Counseling     Are you interested in Tobacco Cessation Medications?  N/A, patient does not use tobacco products Counseled patient on smoking cessation:  N/A, patient does not use tobacco products Reason Tobacco Screening Not Completed: Patient Refused Screening       Social History:  Social History   Substance and Sexual Activity  Alcohol Use Yes   Comment: occ     Social History   Substance and Sexual Activity  Drug Use Yes   Frequency: 7.0 times per week   Types: Marijuana   Comment: daily for adhd    Additional Social History: Marital status: Long term relationship Long term relationship, how long?: I am poly, I have 2 girlfriends  in the bed right now wishing I was there What types of issues is patient dealing with in the relationship?: None reported Are you sexually active?: Yes What is your sexual orientation?: Heterosexual Has your sexual activity been affected by drugs, alcohol, medication, or emotional stress?: No Does patient have children?: Yes How many children?: 3 How is patient's relationship with their children?: I got two older children, and just found out I am the father of a 62 year old daughter I never knew about                         Allergies:   No Known Allergies Lab Results:  Results for orders placed or performed during the hospital encounter of 05/27/24 (from the past 48 hours)  Ethanol     Status: Abnormal   Collection Time: 05/27/24  7:23 PM  Result Value Ref Range   Alcohol, Ethyl (B) 161 (H) <15 mg/dL    Comment: (NOTE) For medical purposes only. Performed at [facility], [address], [place], [place]  [address]   cbc     Status: None   Collection Time: 05/27/24  7:23 PM  Result Value Ref Range   WBC 6.1 4.0 - 10.5 K/uL   RBC 4.77 4.22 - 5.81 MIL/uL   Hemoglobin 14.5 13.0 - 17.0 g/dL   HCT 56.4 60.9 - 47.9 %   MCV 91.2 80.0 - 100.0 fL   MCH 30.4 26.0 - 34.0 pg   MCHC 33.3 30.0 - 36.0 g/dL   RDW 86.7 88.4 - 84.4 %   Platelets 192 150 - 400 K/uL   nRBC 0.0 0.0 - 0.2 %    Comment: Performed at [facility], [address] [place], [place] [address]  Comprehensive metabolic panel with GFR     Status: Abnormal   Collection Time: 05/27/24  7:55 PM  Result Value Ref Range   Sodium 142 135 - 145 mmol/L   Potassium 3.3 (L) 3.5 - 5.1 mmol/L   Chloride 105 98 - 111 mmol/L   CO2 23 22 - 32 mmol/L   Glucose, Bld 100 (H) 70 - 99 mg/dL    Comment: Glucose reference range applies only to samples taken after fasting for at least 8 hours.   BUN 9 6 - 20 mg/dL   Creatinine, Ser 9.07 0.61 - 1.24 mg/dL   Calcium 9.4 8.9 - 89.6 mg/dL   Total Protein 6.8 6.5 - 8.1 g/dL   Albumin 4.6 3.5 - 5.0 g/dL   AST 23 15 - 41 U/L   ALT 19 0 - 44 U/L   Alkaline Phosphatase 82 38 - 126 U/L   Total Bilirubin 0.5 0.0 - 1.2 mg/dL   GFR, Estimated >39 >39 mL/min    Comment: (NOTE) Calculated using the CKD-EPI Creatinine Equation (2021)    Anion gap 14 5 - 15    Comment: Performed at [facility], [address], [place], [place] [address]  Rapid urine drug screen (hospital performed)     Status: Abnormal   Collection Time: 05/28/24  5:00 AM  Result Value Ref Range   Opiates NEGATIVE NEGATIVE   Cocaine NEGATIVE NEGATIVE   Benzodiazepines NEGATIVE NEGATIVE   Amphetamines NEGATIVE NEGATIVE   Tetrahydrocannabinol POSITIVE (A) NEGATIVE   Barbiturates NEGATIVE NEGATIVE   Methadone Scn, Ur NEGATIVE NEGATIVE   Fentanyl  NEGATIVE NEGATIVE    Comment: (NOTE) Drug screen is for Medical Purposes only. Positive results are  preliminary only. If confirmation is needed, notify lab  within 5 days.  Drug Class                 Cutoff (ng/mL) Amphetamine and metabolites 1000 Barbiturate and metabolites 200 Benzodiazepine              200 Opiates and metabolites     300 Cocaine and metabolites     300 THC                         50 Fentanyl                     5 Methadone                   300  Trazodone is metabolized in vivo to several metabolites,  including pharmacologically active m-CPP, which is excreted in the  urine.  Immunoassay screens for amphetamines and MDMA have potential  cross-reactivity with these compounds and may provide false positive  result.  Performed at [facility], [address] [place], [place] [address]     Blood Alcohol level:  Lab Results  Component Value Date   ETH 161 (H) 05/27/2024    Metabolic Disorder Labs:  No results found for: HGBA1C, MPG No results found for: PROLACTIN No results found for: CHOL, TRIG, HDL, CHOLHDL, VLDL, LDLCALC  Current Medications: Current Facility-Administered Medications  Medication Dose Route Frequency Provider Last Rate Last Admin   acetaminophen  (TYLENOL ) tablet 650 mg  650 mg Oral Q6H PRN Kline, Kyra A, NP       alum & mag hydroxide-simeth (MAALOX/MYLANTA) 200-200-20 MG/5ML suspension 30 mL  30 mL Oral Q4H PRN Kline, Kyra A, NP       cloNIDine  (CATAPRES ) tablet 0.1 mg  0.1 mg Oral TID PRN Pashayan, Alexander S, DO   0.1 mg at 05/28/24 1513   haloperidol  (HALDOL ) tablet 5 mg  5 mg Oral TID PRN Kline, Kyra A, NP       And   diphenhydrAMINE  (BENADRYL ) capsule 50 mg  50 mg Oral TID PRN Kline, Kyra A, NP       haloperidol  lactate (HALDOL ) injection 5 mg  5 mg Intramuscular TID PRN Kline, Kyra A, NP       And   diphenhydrAMINE  (BENADRYL ) injection 50 mg  50 mg Intramuscular TID PRN Kline, Kyra A, NP       And   LORazepam  (ATIVAN ) injection 2 mg  2 mg Intramuscular TID PRN Kline, Kyra A, NP       haloperidol  lactate (HALDOL ) injection 10 mg  10 mg  Intramuscular TID PRN Kline, Kyra A, NP       And   diphenhydrAMINE  (BENADRYL ) injection 50 mg  50 mg Intramuscular TID PRN Kline, Kyra A, NP       And   LORazepam  (ATIVAN ) injection 2 mg  2 mg Intramuscular TID PRN Kline, Kyra A, NP       folic acid  (FOLVITE ) tablet 1 mg  1 mg Oral Daily Pashayan, Alexander S, DO   1 mg at 05/28/24 1513   hydrOXYzine  (ATARAX ) tablet 25 mg  25 mg Oral TID PRN Kline, Kyra A, NP       LORazepam  (ATIVAN ) tablet 1-4 mg  1-4 mg Oral Q1H PRN Pashayan, Alexander S, DO       magnesium  hydroxide (MILK OF MAGNESIA) suspension 30 mL  30 mL Oral Daily PRN Kline, Kyra  A, NP       multivitamin with minerals tablet 1 tablet  1 tablet Oral Daily Pashayan, Alexander S, DO   1 tablet at 05/28/24 1513   OLANZapine  (ZYPREXA ) injection 10 mg  10 mg Intramuscular Once PRN Kline, Kyra A, NP       thiamine  (Vitamin B-1) tablet 100 mg  100 mg Oral Daily Pashayan, Alexander S, DO   100 mg at 05/28/24 1513   PTA Medications: No medications prior to admission.    Musculoskeletal: Strength & Muscle Tone: normal Gait & Station: normal Patient leans: NA   Psychiatric Specialty Exam: Physical Exam Constitutional:      Appearance: the patient is not toxic-appearing.  Pulmonary:     Effort: Pulmonary effort is normal.  Neurological:     General: No focal deficit present.     Mental Status: the patient is alert and oriented to person, place, and time.   Review of Systems  Respiratory:  Negative for shortness of breath.   Cardiovascular:  Negative for chest pain.  Gastrointestinal:  Negative for abdominal pain, constipation, diarrhea, nausea and vomiting.  Neurological:  Negative for headaches.      BP (!) 137/97   Pulse (!) 58   Temp 98.2 F (36.8 C) (Oral)   Resp 20   Ht 5' 8 (1.727 m)   Wt 67.7 kg   SpO2 99%   BMI 22.69 kg/m   General Appearance: Fairly Groomed  Eye Contact:  Good  Speech:  Clear and Coherent  Volume:  Normal  Mood:  Euthymic  Affect:   Congruent  Thought Process:  Coherent  Orientation:  Full (Time, Place, and Person)  Thought Content: Logical   Suicidal Thoughts:  No  Homicidal Thoughts:  No  Memory:  Immediate;   Good  Judgement:  fair  Insight:  fair  Psychomotor Activity:  Normal  Concentration:  Concentration: Good  Recall:  Good  Fund of Knowledge: Good  Language: Good  Akathisia:  No  Handed:  not assessed  AIMS (if indicated): not done  Assets:  Communication Skills Desire for Improvement Financial Resources/Insurance Housing Leisure Time Physical Health  ADL's:  Intact  Cognition: WNL  Sleep:  Fair    Treatment Plan Summary: Daily contact with patient to assess and evaluate symptoms and progress in treatment and Medication management  Physician Treatment Plan for Primary Diagnosis: Behavior concern in adult Long Term Goal(s): Improvement in symptoms so as ready for discharge  Short Term Goals: Ability to identify changes in lifestyle to reduce recurrence of condition will improve  Physician Treatment Plan for Secondary Diagnosis: Principal Problem:   Behavior concern in adult Active Problems:   Alcoholic intoxication without complication   Long Term Goal(s): Improvement in symptoms so as ready for discharge  Short Term Goals: Ability to verbalize feelings will improve  ASSESSMENT:   Diagnoses / Active Problems: Behavior concern Possible adjustment disorder Alcohol intoxication   PLAN: Safety and Monitoring:  -- Voluntary admission to inpatient psychiatric unit for safety, stabilization and treatment  -- Daily contact with patient to assess and evaluate symptoms and progress in treatment  -- Patient's case to be discussed in multi-disciplinary team meeting  -- Observation Level : q15 minute checks  -- Vital signs:  q12 hours  -- Precautions: suicide, elopement, and assault  2. Psychiatric Diagnoses and Treatment:  None indicated currently    3. Medical Issues Being  Addressed:   Labs review, notable for none, otherwise unremarkable   Tobacco Use  Disorder  -- Nicotine patch 21mg /24 hours ordered  -- Smoking cessation encouraged  4. Discharge Planning:   -- Social work and case management to assist with discharge planning and identification of hospital follow-up needs prior to discharge  -- Estimated LOS: 5-7 days  -- Discharge Concerns: Need to establish a safety plan; Medication compliance and effectiveness  -- Discharge Goals: Return home with outpatient referrals for mental health follow-up including medication management/psychotherapy   I certify that inpatient services furnished can reasonably be expected to improve the patient's condition.    Karleen Kaufmann, MD PGY-4

## 2024-05-29 NOTE — Group Note (Signed)
 LCSW Group Therapy Note   Group Date: 05/29/2024 Start Time: 1100 End Time: 1200   Participation:  patient was present.  He listened but didn't participate in the discussion.   Type of Therapy:  Group Therapy  Topic:  Healing Flames: Navigating Anger with Compassion  Objective:  Foster self-awareness and promote compassion toward oneself and others when dealing with anger.  Goals:  Help participants understand the underlying emotions and needs fueling anger. Provide coping strategies for healthier emotional expression and anger management.  Summary: This session explored anger as a volcano - an explosion driven by deeper feelings and unmet needs. Participants learned to identify anger triggers and underlying emotions, then practiced coping strategies like deep breathing, physical activity, and journaling. The group discussed healthy ways to manage anger before it escalates, using both personal reflection and shared experiences.  Therapeutic Modalities: Cognitive Behavioral Therapy (CBT): Challenging thoughts that fuel anger. Mindfulness: Increasing awareness of emotions and sensations.   Aeriana Speece O Bertha Lokken, LCSWA 05/29/2024  12:55 PM

## 2024-05-29 NOTE — [facility] Group Notes (Signed)
 Adult Psychoeducational Group Note  Date:  05/29/2024 Time:  9:18 PM  Group Topic/Focus:  Wrap-Up Group:   The focus of this group is to help patients review their daily goal of treatment and discuss progress on daily workbooks.  Participation Level:  Active  Participation Quality:  Appropriate  Affect:  Appropriate  Cognitive:  Appropriate  Insight: Appropriate  Engagement in Group:  Engaged  Modes of Intervention:  Discussion  Additional Comments:    Lang Donia Law 05/29/2024, 9:18 PM

## 2024-05-29 NOTE — Group Note (Signed)
 Date:  05/29/2024 Time:  9:37 AM  Group Topic/Focus:  Goals Group:   The focus of this group is to help patients establish daily goals to achieve during treatment and discuss how the patient can incorporate goal setting into their daily lives to aide in recovery.    Participation Level:  Active  Participation Quality:  Appropriate  Affect:  Appropriate  Cognitive:  Appropriate  Insight: Appropriate  Engagement in Group:  Engaged  Modes of Intervention:  Discussion  Additional Comments:  talk to the doctors about discharge  Nat Rummer 05/29/2024, 9:37 AM

## 2024-05-29 NOTE — BHH Suicide Risk Assessment (Signed)
 [facility] Admission Suicide Risk Assessment   Nursing information obtained from:  Patient Demographic factors:  Male, Adolescent or young adult Current Mental Status:  Self-harm thoughts, Suicidal ideation indicated by patient (Prior to admission) Loss Factors:  Decrease in vocational status Historical Factors:  Impulsivity Risk Reduction Factors:  Employed, Sense of responsibility to family, Living with another person, especially a relative, Positive social support  Total Time spent with patient: 45 min Principal Problem: MDD (major depressive disorder) Diagnosis:  Principal Problem:   MDD (major depressive disorder)   Subjective Data:  The patient is a 38 year old male with no past psychiatric history (per chart review and per patient report).  He was brought from the [address] parking garage to the emergency department on 9/23 by the [facility] Department with reported suicidal thoughts.  He was brought on a voluntary basis, and the patient consistently denied suicidal thoughts or suicidal intentions while in the emergency department and said the police were called due to a misunderstanding with a friend he was talking to.  He was admitted to the behavioral health hospital for further treatment on a voluntary basis.  He signed a 72-hour form for release at 3 PM on 9/24.  On interview today, the patient demonstrates a linear and logical thought process.  He does not appear depressed.  He does appear somewhat excitable and there was some concern for grandiose thinking with the patient reporting he has numerous jobs and multiple girlfriends.  He presents a coherent account of why he was on the parking garage: It is really nice up there and I can see the whole city.  He does report drinking tequila, saying that he does not normally drink that kind of alcohol, and it made me all mixed up with my feelings.  BAL was 161.  He says that he made statements to his friends about his stress and  struggles and that 1 friend misinterpreted him, thinking that he was actually contemplating suicide.  He says that this friend then called the police.  Police did not IVC the patient.  Psychiatric evaluation in the ED recommended voluntary admission to [facility].  Patient reports no prior psychiatric history.  Denies previous suicide attempts or self-harm behavior.  Denies access to firearms.  Says that he lives with his sister, and they are paying the mortgage on a new house.  Patient is frustrated with being here.  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  He wants discharge as soon as possible.  Collateral contact with patient's mother: Darryle Sharps, [phone].  She corroborates the patient's report as described above.  Says that the patient has not had any mental health issues previously.  She says that the patient does not consume alcohol on any regular basis.  She says I think he was just drunk and people did not understand what was going on.  He has been under a lot of stress recently with his jobs.  She denies him having any prior psych history.  Nursing reports say that the patient is doing well on the unit and acting appropriately.   Continued Clinical Symptoms:  Alcohol Use Disorder Identification Test Final Score (AUDIT): 6 The Alcohol Use Disorders Identification Test, Guidelines for Use in Primary Care, Second Edition.  World Science writer [facility]). Score between 0-7:  no or low risk or alcohol related problems. Score between 8-15:  moderate risk of alcohol related problems. Score between 16-19:  high risk of alcohol related  problems. Score 20 or above:  warrants further diagnostic evaluation for alcohol dependence and treatment.   CLINICAL FACTORS:  NA  Psychiatric Specialty Exam: Physical Exam Constitutional:      Appearance: the patient is not toxic-appearing.  Pulmonary:     Effort: Pulmonary effort is normal.  Neurological:     General:  No focal deficit present.     Mental Status: the patient is alert and oriented to person, place, and time.   Review of Systems  Respiratory:  Negative for shortness of breath.   Cardiovascular:  Negative for chest pain.  Gastrointestinal:  Negative for abdominal pain, constipation, diarrhea, nausea and vomiting.  Neurological:  Negative for headaches.      BP (!) 137/97   Pulse (!) 58   Temp 98.2 F (36.8 C) (Oral)   Resp 20   Ht 5' 8 (1.727 m)   Wt 67.7 kg   SpO2 99%   BMI 22.69 kg/m   General Appearance: Fairly Groomed  Eye Contact:  Good  Speech:  Clear and Coherent  Volume:  Normal  Mood:  good  Affect:  Congruent  Thought Process:  Coherent  Orientation:  Full (Time, Place, and Person)  Thought Content: Logical   Suicidal Thoughts:  No  Homicidal Thoughts:  No  Memory:  Immediate;   Good  Judgement:  poor  Insight:  poor  Psychomotor Activity:  Normal  Concentration:  Concentration: Good  Recall:  Good  Fund of Knowledge: Good  Language: Good  Akathisia:  No  Handed:  not assessed  AIMS (if indicated): not done  Assets:  Communication Skills Desire for Improvement Financial Resources/Insurance Housing Leisure Time Physical Health  ADL's:  Intact  Cognition: WNL  Sleep:  Fair      COGNITIVE FEATURES THAT CONTRIBUTE TO RISK:  None    SUICIDE RISK:  Mild: reported pre-admission SI, non substantiated  PLAN OF CARE: See H and P  I certify that inpatient services furnished can reasonably be expected to improve the patient's condition.   Karleen Kaufmann, MD PGY-4

## 2024-05-29 NOTE — Progress Notes (Signed)
(  Sleep Hours) - 5.75 (Any PRNs that were needed, meds refused, or side effects to meds)- None (Any disturbances and when (visitation, over night)-  None (Concerns raised by the patient)- Pt's concern is being discharged.  72 hour request for discharge was signed. (SI/HI/AVH)- Denied
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
## 2024-05-29 NOTE — Progress Notes (Addendum)
 D. Pt has been appropriate during interactions, has been visible in the milieu, observed interacting well with peers and attending group. Pt reported sleeping well last night, described his appetite as 'fair, energy level as 'normal',  and concentration as 'good'. Per pt's self inventory, pt rated his depression,hopelessness and anxiety all 0's. 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 Pt reported that he made a first time mistake, and won't do it again.   Pt currently denies SI/HI and AVH and doesn't appear to be responding to internal stimuli. A. Labs and vitals monitored.Pt supported emotionally and encouraged to express concerns and ask questions.   R. Pt remains safe with 15 minute checks. Will continue POC.    05/29/24 1300  Psych Admission Type (Psych Patients Only)  Admission Status Voluntary/72 hour document signed  Date 72 hour document signed  05/28/24  Time 72 hour document signed  1509  Psychosocial Assessment  Patient Complaints None  Eye Contact Fair  Facial Expression Worried  Affect Appropriate to circumstance  Speech Logical/coherent  Interaction Assertive  Motor Activity Other (Comment) (steady gait)  Appearance/Hygiene Unremarkable  Behavior Characteristics Cooperative  Mood Anxious;Pleasant  Thought Process  Coherency WDL  Content WDL  Delusions None reported or observed  Perception WDL  Hallucination None reported or observed  Judgment Impaired  Confusion None  Danger to Self  Current suicidal ideation? Denies  Danger to Others  Danger to Others None reported or observed

## 2024-05-29 NOTE — Progress Notes (Signed)
 Casey Lawson   Type of Note: Follow up appts  Patient declined both therapy and medication management appointments at discharge stating he does not need them. Resources to be added to f/u if patient changes mind post discharge.  Signed:  Leisha Trinkle, LCSW-A 05/29/2024  10:58 AM

## 2024-05-30 DIAGNOSIS — F69 Unspecified disorder of adult personality and behavior: Secondary | ICD-10-CM

## 2024-05-30 NOTE — BHH Suicide Risk Assessment (Addendum)
 [facility] Discharge Suicide Risk Assessment   Principal Problem: Behavior concern in adult Discharge Diagnoses: Principal Problem:   Behavior concern in adult Active Problems:   Alcoholic intoxication without complication  During the patient's hospitalization, patient had extensive initial psychiatric evaluation, and follow-up psychiatric evaluations every day.  Psychiatric diagnoses provided upon initial assessment: Behavior Concern in an Adult, EtOH Intoxication w/out complication.  Patient's psychiatric medications were adjusted on admission: No medications were started.   During the hospitalization, other adjustments were made to the patient's psychiatric medication regimen: None  Gradually, patient started adjusting to milieu.   Patient's care was discussed during the interdisciplinary team meeting every day during the hospitalization.  The patient reports their target psychiatric symptoms of EtOH Adjustment responded well and the patient reports overall benefit other psychiatric hospitalization. Supportive psychotherapy was provided to the patient. The patient also participated in regular group therapy while admitted.   Labs were reviewed with the patient, and abnormal results were discussed with the patient.  The patient denied having suicidal thoughts more than 48 hours prior to discharge.  Patient denies having homicidal thoughts.  Patient denies having auditory hallucinations.  Patient denies any visual hallucinations.  Patient denies having paranoid thoughts.  The patient is able to verbalize their individual safety plan to this provider.  It is recommended to the patient to follow up with your outpatient psychiatric provider and PCP.  Discussed with the patient, the impact of alcohol, drugs, tobacco have been there overall psychiatric and medical wellbeing, and total abstinence from substance use was recommended the patient.  Total Time spent with patient: 20  minutes  Musculoskeletal: Strength & Muscle Tone: within normal limits Gait & Station: normal Patient leans: N/A  Psychiatric Specialty Exam  Presentation  General Appearance:  Appropriate for Environment; Casual  Eye Contact: Good  Speech: Clear and Coherent; Normal Rate  Speech Volume: Normal  Handedness:No data recorded  Mood and Affect  Mood: Euthymic  Duration of Depression Symptoms: No data recorded Affect: Congruent; Appropriate   Thought Process  Thought Processes: Coherent; Goal Directed  Descriptions of Associations:Intact  Orientation:Full (Time, Place and Person)  Thought Content:Logical; WDL  History of Schizophrenia/Schizoaffective disorder:No data recorded Duration of Psychotic Symptoms:No data recorded Hallucinations:Hallucinations: None  Ideas of Reference:None  Suicidal Thoughts:Suicidal Thoughts: No  Homicidal Thoughts:Homicidal Thoughts: No   Sensorium  Memory: Immediate Good; Recent Good  Judgment: Good  Insight: Good   Executive Functions  Concentration: Good  Attention Span: Good  Recall: Good  Fund of Knowledge: Good  Language: Good   Psychomotor Activity  Psychomotor Activity: Psychomotor Activity: Normal   Assets  Assets: Communication Skills; Desire for Improvement; Financial Resources/Insurance; Housing; Resilience; Physical Health; Social Support   Sleep  Sleep: Sleep: Fair  Estimated Sleeping Duration (Last 24 Hours): 7.50-8.25 hours  Physical Exam: Physical Exam Vitals and nursing note reviewed.  Constitutional:      General: He is not in acute distress.    Appearance: Normal appearance. He is normal weight. He is not ill-appearing or toxic-appearing.  HENT:     Head: Normocephalic and atraumatic.  Pulmonary:     Effort: Pulmonary effort is normal.  Musculoskeletal:        General: Normal range of motion.  Neurological:     General: No focal deficit present.     Mental  Status: He is alert.    Review of Systems  Respiratory:  Negative for cough and shortness of breath.   Cardiovascular:  Negative for chest pain.  Gastrointestinal:  Negative for abdominal pain, constipation, diarrhea, nausea and vomiting.  Neurological:  Negative for dizziness, weakness and headaches.  Psychiatric/Behavioral:  Negative for depression, hallucinations and suicidal ideas. The patient is not nervous/anxious.    Blood pressure (!) 151/104, pulse (!) 48, temperature 97.7 F (36.5 C), temperature source Oral, resp. rate 20, height 5' 8 (1.727 m), weight 67.7 kg, SpO2 100%. Body mass index is 22.69 kg/m.  Mental Status Per Nursing Assessment::   On Admission:  Self-harm thoughts, Suicidal ideation indicated by patient (Prior to admission)  Demographic Factors:  Male  Loss Factors: NA  Historical Factors: NA  Risk Reduction Factors:   Employed, Positive social support, and Positive coping skills or problem solving skills  Continued Clinical Symptoms:  None  Cognitive Features That Contribute To Risk:  None    Suicide Risk:  Minimal: No identifiable suicidal ideation.  Patients presenting with no risk factors but with morbid ruminations; may be classified as minimal risk based on the severity of the depressive symptoms   Follow-up Information     [facility]. Schedule an appointment as soon as possible for a visit.   Specialty: Behavioral Health Why: You may call this provider to schedule an appointment if you wish to receive therapy and/or medication management services. Contact information: [address] [place] [address] [phone]                 Plan Of Care/Follow-up recommendations:  Activity: as tolerated  Diet: heart healthy  Other: -Follow-up with your outpatient psychiatric provider -instructions on appointment date, time, and address (location) are provided to you in discharge paperwork.  -Take your psychiatric  medications as prescribed at discharge - instructions are provided to you in the discharge paperwork  -Follow-up with outpatient primary care doctor and other specialists -for management of chronic medical disease, including: Routine Care.  -Testing: Follow-up with outpatient provider for abnormal lab results: None  -Recommend abstinence from alcohol, tobacco, and other illicit drug use at discharge.   -If your psychiatric symptoms recur, worsen, or if you have side effects to your psychiatric medications, call your outpatient psychiatric provider, 911, [phone] or go to the nearest emergency department.  -If suicidal thoughts recur, call your outpatient psychiatric provider, 911, [phone] or go to the nearest emergency department.   Marsa GORMAN Rosser, DO 05/30/2024, 9:08 AM

## 2024-05-30 NOTE — Progress Notes (Signed)
  [facility] Adult Case Management Discharge Plan :  Will you be returning to the same living situation after discharge:  Yes,  pt returning home at discharge At discharge, do you have transportation home?: Yes,  will be picked up by family around 1015AM  Do you have the ability to pay for your medications: Yes,  pt has active health insurance coverage  Release of information consent forms completed and in the chart;  Patient's signature needed at discharge.  Patient to Follow up at:  Follow-up Information     [facility]. Schedule an appointment as soon as possible for a visit.   Specialty: Behavioral Health Why: You may call this provider to schedule an appointment if you wish to receive therapy and/or medication management services. Contact information: [address] [place] [address] [phone]                 Next level of care provider has access to [facility] Link:no  Safety Planning and Suicide Prevention discussed: Yes,  completed with patient at discharge, attempted to reach grandmother Lowry Bala [phone]. MD spoke with pt's mother.     Has patient been referred to the Quitline?: Patient does not use tobacco/nicotine products  Patient has been referred for addiction treatment: No known substance use disorder.  Jenkins LULLA Primer, LCSWA 05/30/2024, 9:11 AM

## 2024-05-30 NOTE — Group Note (Signed)
 Date:  05/30/2024 Time:  9:19 AM  Group Topic/Focus:  Goals Group:   The focus of this group is to help patients establish daily goals to achieve during treatment and discuss how the patient can incorporate goal setting into their daily lives to aide in recovery. Patients were asked two questions: What is something you can do today to take care of your mental health? What is 1 thing you're proud of doing lately?    Participation Level:  None  Participation Quality:  Resistant  Affect:  Defensive and Resistant  Cognitive:  Alert  Insight: Lacking  Engagement in Group:  Lacking and Resistant  Modes of Intervention:  Discussion, Socialization, and Support  Additional Comments:  Patient attended group but did not wish to participate. He did listen to what his peers were sharing, but he declined to answer the questions verbally or on a piece of paper.   Kristi HERO Deborahann Poteat 05/30/2024, 9:19 AM

## 2024-05-30 NOTE — Discharge Summary (Signed)
 Physician Discharge Summary Note  Patient:  Casey Lawson is an 38 y.o., male MRN:  [id number] DOB:  March 15, 1986 Patient phone:  [phone] (home)  Patient address:   [address] [place] [address],  Total Time spent with patient: 20 minutes  Date of Admission:  05/28/2024 Date of Discharge: 05/30/2024  Reason for Admission:   The patient is a 38 year old male with no past psychiatric history (per chart review and per patient report).  He was brought from the [address] parking garage to the emergency department on 9/23 by the [facility] Department with reported suicidal thoughts.  He was brought on a voluntary basis, and the patient consistently denied suicidal thoughts or suicidal intentions while in the emergency department and said the police were called due to a misunderstanding with a friend he was talking to.  He was admitted to the behavioral health hospital for further treatment on a voluntary basis.  He signed a 72-hour form for release at 3 PM on 9/24.   On interview today, the patient demonstrates a linear and logical thought process.  He does not appear depressed.  He does appear somewhat excitable and there was some concern for grandiose thinking with the patient reporting he has numerous jobs and multiple girlfriends.  He presents a coherent account of why he was on the parking garage: It is really nice up there and I can see the whole city.  He does report drinking tequila, saying that he does not normally drink that kind of alcohol, and it made me all mixed up with my feelings.  BAL was 161.  He says that he made statements to his friends about his stress and struggles and that 1 friend misinterpreted him, thinking that he was actually contemplating suicide.  He says that this friend then called the police.  Police did not IVC the patient.  Psychiatric evaluation in the ED recommended voluntary admission to [facility].   Patient reports no prior psychiatric  history.  Denies previous suicide attempts or self-harm behavior.  Denies access to firearms.  Says that he lives with his sister, and they are paying the mortgage on a new house.  Principal Problem: Behavior concern in adult Discharge Diagnoses: Principal Problem:   Behavior concern in adult Active Problems:   Alcoholic intoxication without complication   Past Psychiatric History:  Patient reports no prior psychiatric history. Denies previous suicide attempts or self-harm behavior.   Past Medical History:  Past Medical History:  Diagnosis Date   ADHD (attention deficit hyperactivity disorder)    Closed left ankle fracture 03/24/2022   Migraine    Skin abnormality 03/24/2022   left ankle scraped area healing with scab, using mupirocin  ointment to   Uses crutches     Past Surgical History:  Procedure Laterality Date   arm surgery Left 2010   ORIF ANKLE FRACTURE Left 04/04/2022   Procedure: OPEN REDUCTION INTERNAL FIXATION (ORIF) ANKLE FRACTURE;  Surgeon: Beverley Evalene BIRCH, MD;  Location: [facility];  Service: Orthopedics;  Laterality: Left;   Family History:  Family History  Problem Relation Age of Onset   Healthy Mother    Healthy Father    Family Psychiatric  History:  none pertinent   Social History:  Social History   Substance and Sexual Activity  Alcohol Use Yes   Comment: occ     Social History   Substance and Sexual Activity  Drug Use Yes   Frequency: 7.0 times per week  Types: Marijuana   Comment: daily for adhd    Social History   Socioeconomic History   Marital status: Single    Spouse name: Not on file   Number of children: Not on file   Years of education: Not on file   Highest education level: Not on file  Occupational History   Not on file  Tobacco Use   Smoking status: Never   Smokeless tobacco: Never   Tobacco comments:    hookah  Vaping Use   Vaping status: Never Used  Substance and Sexual Activity   Alcohol use: Yes     Comment: occ   Drug use: Yes    Frequency: 7.0 times per week    Types: Marijuana    Comment: daily for adhd   Sexual activity: Yes    Birth control/protection: None  Other Topics Concern   Not on file  Social History Narrative   Not on file   Social Drivers of Health   Financial Resource Strain: Not on file  Food Insecurity: No Food Insecurity (05/28/2024)   Hunger Vital Sign    Worried About Running Out of Food in the Last Year: Never true    Ran Out of Food in the Last Year: Never true  Transportation Needs: No Transportation Needs (05/28/2024)   PRAPARE - Administrator, Civil Service (Medical): No    Lack of Transportation (Non-Medical): No  Physical Activity: Not on file  Stress: Not on file  Social Connections: Not on file    Hospital Course:   During the patient's hospitalization, patient had extensive initial psychiatric evaluation, and follow-up psychiatric evaluations every day.  Psychiatric diagnoses provided upon initial assessment: Behavior Concern in an Adult, EtOH Intoxication w/out complication.   Patient's psychiatric medications were adjusted on admission: No medications were started.   During the hospitalization, other adjustments were made to the patient's psychiatric medication regimen: None  Patient's care was discussed during the interdisciplinary team meeting every day during the hospitalization.  Gradually, patient started adjusting to milieu. The patient was evaluated each day by a clinical provider to ascertain response to treatment. Improvement was noted by the patient's report of decreasing symptoms, improved sleep and appetite, affect, medication tolerance, behavior, and participation in unit programming.  Patient was asked each day to complete a self inventory noting mood, mental status, pain, new symptoms, anxiety and concerns.   Symptoms were reported as significantly decreased or resolved completely by discharge.  The patient  reports that their mood is stable.  The patient denied having suicidal thoughts for more than 48 hours prior to discharge.  Patient denies having homicidal thoughts.  Patient denies having auditory hallucinations.  Patient denies any visual hallucinations or other symptoms of psychosis.  The patient was motivated to continue taking medication with a goal of continued improvement in mental health.   The patient reports their target psychiatric symptoms of EtOH intoxication responded well to the psychiatric medications, and the patient reports overall benefit other psychiatric hospitalization. Supportive psychotherapy was provided to the patient. The patient also participated in regular group therapy while hospitalized. Coping skills, problem solving as well as relaxation therapies were also part of the unit programming.  Labs were reviewed with the patient, and abnormal results were discussed with the patient.  The patient is able to verbalize their individual safety plan to this provider.  # It is recommended to the patient to follow up with your outpatient psychiatric provider and PCP.  # It was  discussed with the patient, the impact of alcohol, drugs, tobacco have been there overall psychiatric and medical wellbeing, and total abstinence from substance use was recommended the patient.ed.   # In the event of worsening symptoms, the patient is instructed to call the crisis hotline, 911 and or go to the nearest ED for appropriate evaluation and treatment of symptoms. To follow-up with primary care provider for other medical issues, concerns and or health care needs  # Patient was discharged home with a plan to follow up as noted below.    On day of discharge he reports feeling good.  He reports that he has been able to do some thinking while hospitalized and it has been helpful.  He reports his sleep was fair last night.  He reports his appetite is good.  He reports no SI, HI, or AVH.  Discussed  with him what to do in the event of a future crisis.  Discussed that he can go to [facility], go to the nearest ED, or call 911 or 988.   He reported understanding and had no concerns.  He was discharged home.   Physical Findings: AIMS:  , ,  ,  ,  ,  ,   CIWA:    COWS:     Musculoskeletal: Strength & Muscle Tone: within normal limits Gait & Station: normal Patient leans: N/A   Psychiatric Specialty Exam:  Presentation  General Appearance:  Appropriate for Environment; Casual  Eye Contact: Good  Speech: Clear and Coherent; Normal Rate  Speech Volume: Normal  Handedness:No data recorded  Mood and Affect  Mood: Euthymic  Affect: Congruent; Appropriate   Thought Process  Thought Processes: Coherent; Goal Directed  Descriptions of Associations:Intact  Orientation:Full (Time, Place and Person)  Thought Content:Logical; WDL  History of Schizophrenia/Schizoaffective disorder:No data recorded Duration of Psychotic Symptoms:No data recorded Hallucinations:Hallucinations: None  Ideas of Reference:None  Suicidal Thoughts:Suicidal Thoughts: No  Homicidal Thoughts:Homicidal Thoughts: No   Sensorium  Memory: Immediate Good; Recent Good  Judgment: Good  Insight: Good   Executive Functions  Concentration: Good  Attention Span: Good  Recall: Good  Fund of Knowledge: Good  Language: Good   Psychomotor Activity  Psychomotor Activity: Psychomotor Activity: Normal   Assets  Assets: Communication Skills; Desire for Improvement; Financial Resources/Insurance; Housing; Resilience; Physical Health; Social Support   Sleep  Sleep: Sleep: Fair  Estimated Sleeping Duration (Last 24 Hours): 7.50-8.25 hours   Physical Exam: Physical Exam Vitals and nursing note reviewed.  Constitutional:      General: He is not in acute distress.    Appearance: Normal appearance. He is normal weight. He is not ill-appearing or toxic-appearing.  HENT:      Head: Normocephalic and atraumatic.  Pulmonary:     Effort: Pulmonary effort is normal.  Musculoskeletal:        General: Normal range of motion.  Neurological:     General: No focal deficit present.     Mental Status: He is alert.    Review of Systems  Respiratory:  Negative for cough and shortness of breath.   Cardiovascular:  Negative for chest pain.  Gastrointestinal:  Negative for abdominal pain, constipation, diarrhea, nausea and vomiting.  Neurological:  Negative for dizziness, weakness and headaches.  Psychiatric/Behavioral:  Negative for depression, hallucinations and suicidal ideas. The patient is not nervous/anxious.    Blood pressure (!) 151/104, pulse (!) 48, temperature 97.7 F (36.5 C), temperature source Oral, resp. rate 20, height 5' 8 (1.727 m), weight 67.7 kg,  SpO2 100%. Body mass index is 22.69 kg/m.   Social History   Tobacco Use  Smoking Status Never  Smokeless Tobacco Never  Tobacco Comments   hookah   Tobacco Cessation:  N/A, patient does not currently use tobacco products   Blood Alcohol level:  Lab Results  Component Value Date   ETH 161 (H) 05/27/2024    Metabolic Disorder Labs:  No results found for: HGBA1C, MPG No results found for: PROLACTIN No results found for: CHOL, TRIG, HDL, CHOLHDL, VLDL, LDLCALC  See Psychiatric Specialty Exam and Suicide Risk Assessment completed by Attending Physician prior to discharge.  Discharge destination:  Home  Is patient on multiple antipsychotic therapies at discharge:  No   Has Patient had three or more failed trials of antipsychotic monotherapy by history:  No  Recommended Plan for Multiple Antipsychotic Therapies: NA  Discharge Instructions     Diet - low sodium heart healthy   Complete by: As directed    Increase activity slowly   Complete by: As directed       Allergies as of 05/30/2024   No Known Allergies      Medication List    You have not been prescribed  any medications.     Follow-up Information     [facility]. Schedule an appointment as soon as possible for a visit.   Specialty: Behavioral Health Why: You may call this provider to schedule an appointment if you wish to receive therapy and/or medication management services. Contact information: [address] [place] [address] [phone]                 Follow-up recommendations/Comments:   Activity: as tolerated   Diet: heart healthy   Other: -Follow-up with your outpatient psychiatric provider -instructions on appointment date, time, and address (location) are provided to you in discharge paperwork.   -Take your psychiatric medications as prescribed at discharge - instructions are provided to you in the discharge paperwork   -Follow-up with outpatient primary care doctor and other specialists -for management of chronic medical disease, including: Routine Care.   -Testing: Follow-up with outpatient provider for abnormal lab results: None   -Recommend abstinence from alcohol, tobacco, and other illicit drug use at discharge.    -If your psychiatric symptoms recur, worsen, or if you have side effects to your psychiatric medications, call your outpatient psychiatric provider, 911, 988 or go to the nearest emergency department.   -If suicidal thoughts recur, call your outpatient psychiatric provider, 911, 988 or go to the nearest emergency department.      Signed: Marsa GORMAN Rosser, DO 05/30/2024, 9:20 AM

## 2024-05-30 NOTE — Progress Notes (Signed)
 Casey Lawson  D/C'd Home per MD order.  Discussed with the patient and all questions fully answered.   An After Visit Summary was printed and given to the patient. Patient received prescription.  D/c education completed with patient including follow up instructions, medication list, d/c activities limitations if indicated, with other d/c instructions as indicated by MD - patient able to verbalize understanding, all questions fully answered.   Patient instructed to return to ED, call 911, or call MD for any changes in condition.   Patient escorted to the main entarnce, and D/C home via private auto.  [name] Doing 05/30/2024 10:58 AM
